# Patient Record
Sex: Male | Born: 1980 | Race: White | Hispanic: No | Marital: Single | Smoking: Light tobacco smoker
Health system: Southern US, Community
[De-identification: ages and names within clinical notes are randomized; demographics above are authoritative.]

## PROBLEM LIST (undated history)

## (undated) DIAGNOSIS — E785 Hyperlipidemia, unspecified: Secondary | ICD-10-CM

## (undated) DIAGNOSIS — L509 Urticaria, unspecified: Secondary | ICD-10-CM

## (undated) HISTORY — DX: Hyperlipidemia, unspecified: E78.5

## (undated) HISTORY — PX: WISDOM TOOTH EXTRACTION: SHX21

## (undated) HISTORY — DX: Urticaria, unspecified: L50.9

---

## 2017-04-15 ENCOUNTER — Encounter: Payer: Self-pay | Admitting: Allergy and Immunology

## 2017-04-15 ENCOUNTER — Ambulatory Visit (INDEPENDENT_AMBULATORY_CARE_PROVIDER_SITE_OTHER): Payer: 59 | Admitting: Allergy and Immunology

## 2017-04-15 VITALS — BP 140/102 | HR 100 | Temp 99.6°F | Resp 18 | Ht 69.17 in | Wt 205.0 lb

## 2017-04-15 DIAGNOSIS — I10 Essential (primary) hypertension: Secondary | ICD-10-CM | POA: Diagnosis not present

## 2017-04-15 DIAGNOSIS — T7840XA Allergy, unspecified, initial encounter: Secondary | ICD-10-CM | POA: Diagnosis not present

## 2017-04-15 DIAGNOSIS — L719 Rosacea, unspecified: Secondary | ICD-10-CM

## 2017-04-15 NOTE — Progress Notes (Signed)
NEW PATIENT NOTE  Referring Provider: No ref. provider found Primary Provider: Patient, No Pcp Per Date of office visit: 04/15/2017    Subjective:   Chief Complaint:  Brian Rangel (DOB: 24-Mar-1981) is a 36 y.o. male who presents to the clinic on 04/15/2017 with a chief complaint of Rash (Began in April.) .  HPI: Brian Rangel presents to this clinic in evaluation of allergic reaction. Apparently in April he developed red raised slightly itchy warm lesions across his face that ended up spreading to other areas of his body without any associated systemic or constitutional symptoms and with response to over-the-counter antihistamines within hours. His lesions never healed with scar or hyperpigmentation. There was no obvious trigger giving rise to this issue. On his first reaction, which was his worst, he went to the urgent care center and received a systemic steroid and then he subsequently visited with [facility] dermatology and was treated with prednisone and Allegra about a month ago. Over the course of the past 2 weeks he's had minimal issue and over the course of the past week he has really had no issue.  As stated above there was no trigger for this reactivity. He does not have any symptoms suggesting an infection, he is not sexually active and has not been for 2018, he takes no over-the-counter medications or health foods or herbs. His environment has not changed to any significant degree.  He does have a history of remaining red in his face for a prolonged period in time. Whenever he gets warm and hot he does flush. This has been a long-standing issue. He also had a history of childhood hives whenever he mowed the grass which has since resolved. Apparently he was skin tested sometime in elementary school and found to be allergic to pollens and dust and cats.  He has also been told that he has high blood pressure. Apparently when he checks his blood pressure at home it is normal.  History  reviewed. No pertinent past medical history.  Past Surgical History:  Procedure Laterality Date  . WISDOM TOOTH EXTRACTION      Allergies as of 04/15/2017   No Known Allergies     Medication List      BENADRYL 25 MG tablet Generic drug:  diphenhydrAMINE Take 25 mg by mouth as needed.   fexofenadine 180 MG tablet Commonly known as:  ALLEGRA Take 180 mg by mouth 2 (two) times daily.       Review of systems negative except as noted in HPI / PMHx or noted below:  Review of Systems  Constitutional: Negative.   HENT: Negative.   Eyes: Negative.   Respiratory: Negative.   Cardiovascular: Negative.   Gastrointestinal: Negative.   Genitourinary: Negative.   Musculoskeletal: Negative.   Skin: Negative.   Neurological: Negative.   Endo/Heme/Allergies: Negative.   Psychiatric/Behavioral: Negative.     History reviewed. No pertinent family history.  Social History   Social History  . Marital status: Single    Spouse name: N/A  . Number of children: N/A  . Years of education: N/A   Occupational History  . Not on file.   Social History Main Topics  . Smoking status: Light Tobacco Smoker  . Smokeless tobacco: Never Used  . Alcohol use Yes  . Drug use: No  . Sexual activity: Not on file   Other Topics Concern  . Not on file   Social History Narrative  . No narrative on file  Environmental and Social history  Lives in a house with a dry environment, a dog located inside the household, carpeting in the bedroom, no plastic on the bed or pillow, no smoking ongoing with inside the household. He works as a Associate Professorpharmacy tech.  Objective:   Vitals:   04/15/17 1347  BP: (!) 140/102  Pulse: 100  Resp: 18  Temp: 99.6 F (37.6 C)   Height: 5' 9.17" (175.7 cm) Weight: 205 lb (93 kg)  Physical Exam  Constitutional: He is well-developed, well-nourished, and in no distress.  HENT:  Head: Normocephalic. Head is without right periorbital erythema and without left  periorbital erythema.  Right Ear: Tympanic membrane, external ear and ear canal normal.  Left Ear: Tympanic membrane, external ear and ear canal normal.  Nose: Nose normal. No mucosal edema or rhinorrhea.  Mouth/Throat: Oropharynx is clear and moist and mucous membranes are normal. No oropharyngeal exudate.  Eyes: Conjunctivae and lids are normal. Pupils are equal, round, and reactive to light.  Neck: Trachea normal. No tracheal deviation present. No thyromegaly present.  Cardiovascular: Normal rate, regular rhythm, S1 normal, S2 normal and normal heart sounds.   No murmur heard. Pulmonary/Chest: Effort normal. No stridor. No tachypnea. No respiratory distress. He has no wheezes. He has no rales. He exhibits no tenderness.  Abdominal: Soft. He exhibits no distension and no mass. There is no hepatosplenomegaly. There is no tenderness. There is no rebound and no guarding.  Musculoskeletal: He exhibits no edema or tenderness.  Lymphadenopathy:       Head (right side): No tonsillar adenopathy present.       Head (left side): No tonsillar adenopathy present.    He has no cervical adenopathy.    He has no axillary adenopathy.  Neurological: He is alert. Gait normal.  Skin: No rash (Facial erythema and telangiectasia.) noted. He is not diaphoretic. No erythema. No pallor. Nails show no clubbing.  Psychiatric: Mood and affect normal.    Diagnostics:  None   Assessment and Plan:    1. Allergic reaction, initial encounter   2. Rosacea   3. Hypertension, unspecified type     1. Contact clinic if reactions reoccur  2. Check blood pressure  3. Consider therapy for rosacea  It does sound as though Brian Rangel has developed some type of immunological overactivity sometime in April but this appears to be burning out and at this point in time because he is so much better and has had no activity over the course of the past week, he is going to hold off on any further evaluation or treatment for this  condition. Certainly if this is recurrent he will require further evaluation and he will contact me should he have any significant problems as he moves forward. He also appears to have rosacea but at this point is not that interested in having this treated. He has systemic arterial hypertension and I've asked him to make sure that this is a isolated reading and does not signify a trend. As well, he does have a low-grade fever of unknown etiology but we are not going to have him evaluated for this issue at this point in time unless it evolves into something else. Should he develop recurrent problems with allergic reactions in the future he will need blood tests including investigation of alpha gal and possible flushing disorder.  Laurette SchimkeEric Evelynn Hench, MD Allergy / Immunology [facility]

## 2017-04-15 NOTE — Patient Instructions (Addendum)
  1. Contact clinic if reactions reoccur  2. Check blood pressure  3. Consider therapy for rosacea

## 2017-06-11 ENCOUNTER — Ambulatory Visit (INDEPENDENT_AMBULATORY_CARE_PROVIDER_SITE_OTHER): Payer: 59 | Admitting: Allergy

## 2017-06-11 ENCOUNTER — Encounter: Payer: Self-pay | Admitting: Allergy

## 2017-06-11 VITALS — BP 142/102 | HR 92 | Resp 18

## 2017-06-11 DIAGNOSIS — R21 Rash and other nonspecific skin eruption: Secondary | ICD-10-CM

## 2017-06-11 DIAGNOSIS — T7840XD Allergy, unspecified, subsequent encounter: Secondary | ICD-10-CM | POA: Diagnosis not present

## 2017-06-11 NOTE — Progress Notes (Signed)
Follow-up Note  RE: Brian Rifflearon Haren MRN: [id number] DOB: 20-Apr-1981 Date of Office Visit: 06/11/2017   History of present illness: Brian Rangel is a 36 y.o. male presenting today for follow-up of rash last allergic reaction. He was last seen in the office on 04/15/2017 by Dr. Lucie LeatherKozlow for an initial visit at which time he did not have any subsequent significant rash other than facial erythema and telangiectasia most consistent with rosacea. He was advised to return to the office if his rash developed. He presents today as he woke up this morning with significant rash.  He states he had had an episode of this same rash earlier in June prior to his first appointment with us. By the time of his appointment the rash had resolved. However the rash is very visible today. The rash appears on his face, neck, chest, arms, legs but is absent from his back. He states the rash on his neck is the only area that the rash felt very raised and palpable. He states the rash is not itchy and he otherwise doesn't notice the rash but he woke up as he felt very hot and then noted that he was covered in this rash. He states when he had the same appearing rash previously he was using antihistamine but was able to stop with resolution of the rash. He was not able to identify any obvious triggers at that time. The rash does not leave any marks or bruising.     today she still does not identify any obvious triggers of this rash. He states for dinner he ate a chef salad with ham, cheese and ranch dressing. He denies any known tick bites in the past. He states he is not a large red meat eater.  He denies any stings, no change in soaps or lotions or detergents, no new medications, no new foods, and no recent illnesses. He denies any respiratory, GI, CV related symptoms with this rash. He also denies any joint aches or pains and no swelling.        Review of systems: ROS  All other systems negative unless noted above in HPI  Past  medical/social/surgical/family history have been reviewed and are unchanged unless specifically indicated below.  No changes  Medication List: Allergies as of 06/11/2017   No Known Allergies     Medication List       Accurate as of 06/11/17  5:06 PM. Always use your most recent med list.          BENADRYL 25 MG tablet Generic drug:  diphenhydrAMINE Take 25 mg by mouth as needed.       Known medication allergies: No Known Allergies   Physical examination: Blood pressure (!) 142/102, pulse 92, resp. rate 18.  General: Alert, interactive, in no acute distress. HEENT: PERRLA, TMs pearly gray, turbinates minimally edematous without discharge, post-pharynx non erythematous. Neck: Supple without lymphadenopathy. Lungs: Clear to auscultation without wheezing, rhonchi or rales. {no increased work of breathing. CV: Normal S1, S2 without murmurs. Abdomen: Nondistended, nontender. No HSM Skin: Serpiginous-like rash with erythematous border and central clearing very consistent with an urticarial-like rash over arms, chest, legs, neck extending to the face. He remains with facial erythema and telangiectasia. Extremities:  No clubbing, cyanosis or edema. Neuro:   Grossly intact.  Diagnositics/Labs:  Allergy testing: Deferred due to ongoing rash  Assessment and plan:   Rash/Allergic reaction     - rash appears urticarial in nature however without pruritus and no systemic  symptoms (GI, respiratory, cardiovascular).   Erythema marginatum is a serpiginous rash with similar appearance to patient's rash and it is usually non-pruritic associated with rheumatic fever/Infections however he has no recent illness and no recent strep infections.   He does not have any blistering, scaling or peeling, pustules or purpura suggestive of other more serious type rashes.   He does not having any systemic symptoms associated with this rash that may be suggestive of infection, rheumatologic or autoimmune  disease. At this time unknown trigger of this rash.  May have an allergic cause.  Will obtain screening labwork at this time: CBC, CMP, hive panel, alpha-gal panel, tryptase, inflammatory markers.        - resume use of allegra 180  2 tabs twice a day and Zantac 150mg  1 tab twice a day.  Reserve benadyrl for breakthrough symptoms.       - let us know if you develop fevers, swelling, joint aches/pains or any other symptoms with the rash.       - If rash persists may warrant additional dermatology evaluation with biopsy  Elevated Blood pressure    - BP today is elevated.  Would discuss this with your PCP as may need antihypertensives for control     - monitor ambulatory BPs  Follow-up 6-8 weeks or sooner if needed  I appreciate the opportunity to take part in Judson's care. Please do not hesitate to contact me with questions.  Sincerely,   Margo Aye, MD Allergy/Immunology [facility]

## 2017-06-11 NOTE — Patient Instructions (Addendum)
Rash     - rash appears urticarial in nature however without pruritus (itch) and no systemic symptoms (GI, respiratory, cardiovascular).  At this time unknown trigger of this rash.  May have an allergic cause.  Will obtain screening labwork at this time: CBC, CMP, hive panel, alpha-gal panel, tryptase, inflammatory markers.  Will call with results     - resume use of allegra 180  2 tabs twice a day and Zantac 150mg  1 tab twice a day.  Reserve benadyrl for breakthrough symptoms.       - let us know if you develop fevers, swelling, joint aches/pains or any other symptoms with the rash.    High Blood pressure    - BP today is elevated.  Would discuss this with your PCP as may need antihypertensives for control     - monitor ambulatory BPs  Follow-up 6-8 weeks or sooner if needed

## 2017-06-18 LAB — C-REACTIVE PROTEIN: CRP: 1.1 mg/L (ref 0.0–4.9)

## 2017-06-18 LAB — COMPREHENSIVE METABOLIC PANEL
A/G RATIO: 1.9 (ref 1.2–2.2)
ALT: 77 IU/L — AB (ref 0–44)
AST: 55 IU/L — AB (ref 0–40)
Albumin: 4.6 g/dL (ref 3.5–5.5)
Alkaline Phosphatase: 71 IU/L (ref 39–117)
BUN/Creatinine Ratio: 12 (ref 9–20)
BUN: 11 mg/dL (ref 6–20)
Bilirubin Total: 0.6 mg/dL (ref 0.0–1.2)
CALCIUM: 9.5 mg/dL (ref 8.7–10.2)
CO2: 22 mmol/L (ref 20–29)
CREATININE: 0.93 mg/dL (ref 0.76–1.27)
Chloride: 102 mmol/L (ref 96–106)
GFR calc Af Amer: 120 mL/min/{1.73_m2} (ref >59)
GFR, EST NON AFRICAN AMERICAN: 104 mL/min/{1.73_m2} (ref >59)
GLOBULIN, TOTAL: 2.4 g/dL (ref 1.5–4.5)
Glucose: 99 mg/dL (ref 65–99)
POTASSIUM: 4.1 mmol/L (ref 3.5–5.2)
SODIUM: 143 mmol/L (ref 134–144)
TOTAL PROTEIN: 7 g/dL (ref 6.0–8.5)

## 2017-06-18 LAB — ALPHA-GAL PANEL
Alpha Gal IgE*: <0.1 kU/L (ref <0.35)
BEEF CLASS INTERPRETATION: 0
Beef (Bos spp) IgE: <0.1 kU/L (ref <0.35)
Class Interpretation: 0
Class Interpretation: 0
Pork (Sus spp) IgE: <0.1 kU/L (ref <0.35)

## 2017-06-18 LAB — CBC WITH DIFFERENTIAL/PLATELET
BASOS: 0 %
Basophils Absolute: 0 10*3/uL (ref 0.0–0.2)
EOS (ABSOLUTE): 0.1 10*3/uL (ref 0.0–0.4)
EOS: 1 %
HEMATOCRIT: 48.6 % (ref 37.5–51.0)
Hemoglobin: 16.7 g/dL (ref 13.0–17.7)
IMMATURE GRANULOCYTES: 0 %
Immature Grans (Abs): 0 10*3/uL (ref 0.0–0.1)
Lymphocytes Absolute: 1.8 10*3/uL (ref 0.7–3.1)
Lymphs: 23 %
MCH: 33.1 pg — ABNORMAL HIGH (ref 26.6–33.0)
MCHC: 34.4 g/dL (ref 31.5–35.7)
MCV: 96 fL (ref 79–97)
MONOCYTES: 6 %
Monocytes Absolute: 0.4 10*3/uL (ref 0.1–0.9)
NEUTROS PCT: 70 %
Neutrophils Absolute: 5.2 10*3/uL (ref 1.4–7.0)
Platelets: 175 10*3/uL (ref 150–379)
RBC: 5.04 x10E6/uL (ref 4.14–5.80)
RDW: 14.6 % (ref 12.3–15.4)
WBC: 7.5 10*3/uL (ref 3.4–10.8)

## 2017-06-18 LAB — SEDIMENTATION RATE: Sed Rate: 2 mm/hr (ref 0–15)

## 2017-06-18 LAB — CHRONIC URTICARIA: cu index: 3.2 (ref <10)

## 2017-06-18 LAB — TRYPTASE: TRYPTASE: 4.1 ug/L (ref 2.2–13.2)

## 2017-06-19 ENCOUNTER — Telehealth: Payer: Self-pay | Admitting: Allergy

## 2017-06-19 NOTE — Telephone Encounter (Signed)
Resulted out labs.  Someone can call him tomorrow.

## 2017-06-19 NOTE — Telephone Encounter (Signed)
Brian Rangel has called twice to get his lab results.  Please advise.

## 2017-06-20 ENCOUNTER — Other Ambulatory Visit: Payer: Self-pay | Admitting: *Deleted

## 2017-06-20 MED ORDER — MONTELUKAST SODIUM 10 MG PO TABS: ORAL_TABLET | ORAL | 5 refills | Status: DC

## 2017-07-05 ENCOUNTER — Other Ambulatory Visit: Payer: Self-pay | Admitting: *Deleted

## 2017-07-05 ENCOUNTER — Telehealth: Payer: Self-pay | Admitting: Allergy & Immunology

## 2017-07-05 MED ORDER — PREDNISONE 10 MG PO TABS: ORAL_TABLET | ORAL | 0 refills | Status: DC

## 2017-07-05 NOTE — Telephone Encounter (Signed)
Patient informed. 

## 2017-07-05 NOTE — Telephone Encounter (Signed)
He can take Allegra 2 tabs twice a day and the other medication remains how he is currently taking.  To help decrease symptoms please have him take prednisone 20mg  twice a day x 5 days.    He may be a candidate for Xolair.

## 2017-07-05 NOTE — Telephone Encounter (Signed)
Prednisone sent, left message for patient to call.

## 2017-07-05 NOTE — Telephone Encounter (Signed)
Brian Rangel has seen both Dr. Lucie LeatherKozlow and Dr. Delorse LekPadgett.  Recently Brian CustardAaron came in to see Dr. Delorse LekPadgett for an all over body rash.  Dr. Delorse LekPadgett sent him for blood work which came back negative.  Brian Rangel has been taking SINGULAIR once a day and ALLEGRA and ZANTAC twice a day.  Brian Rangel also has taken BENADRYL and nothing is helping the rash.  He stated the rash is on his arms,chest,neck, and face.  He also stated the itching is severe and would like something to help.  Please advise.

## 2017-07-26 ENCOUNTER — Telehealth: Payer: Self-pay | Admitting: *Deleted

## 2017-07-26 NOTE — Telephone Encounter (Signed)
Patient called and advised that he is still breaking out and Dr Delorse Lek had mentioned that he might be candidate for Xolair.  He advised still using all meds and still symptomatic.  I called Dr Delorse Lek and she advised that ok to submit for Xolair.  Reviewed with patient process and he came by office and signed auth to submit and was given copay and submit info.  Will be in contact once Rx ready and shipped to get him started.

## 2017-08-06 ENCOUNTER — Other Ambulatory Visit: Payer: Self-pay | Admitting: *Deleted

## 2017-08-06 ENCOUNTER — Telehealth: Payer: Self-pay | Admitting: *Deleted

## 2017-08-06 MED ORDER — PREDNISONE 10 MG PO TABS: ORAL_TABLET | ORAL | 0 refills | Status: DC

## 2017-08-06 NOTE — Telephone Encounter (Signed)
Please have patient use prednisone 10 mg once a day for 7 days only

## 2017-08-06 NOTE — Telephone Encounter (Signed)
Patient informed, rx sent.  

## 2017-08-06 NOTE — Telephone Encounter (Signed)
Patient called advised having hives really bad.  Due to start Xolair possibly on Monday cannot any sooner due to working 10 hour days.  He advised is taking antihistamine and benadryl with no relief.  He is requesting Prednisone to help flareup until he can start his Xolair.FYI this Brian Rangel patient but she is currently out of office advised him will send to another provider

## 2017-08-15 ENCOUNTER — Ambulatory Visit (INDEPENDENT_AMBULATORY_CARE_PROVIDER_SITE_OTHER): Payer: 59 | Admitting: *Deleted

## 2017-08-15 DIAGNOSIS — L501 Idiopathic urticaria: Secondary | ICD-10-CM | POA: Diagnosis not present

## 2017-08-15 MED ORDER — AUVI-Q 0.3 MG/0.3ML IJ SOAJ: INTRAMUSCULAR | 3 refills | Status: DC

## 2017-08-15 MED ORDER — OMALIZUMAB 150 MG ~~LOC~~ SOLR
300.0000 mg | SUBCUTANEOUS | Status: DC
  Administered 2017-08-15 – 2018-07-16 (×12): 300 mg via SUBCUTANEOUS

## 2017-08-15 NOTE — Progress Notes (Signed)
Patient started Xolair injections today for his hives - he will receive 300 mg every 4 weeks.  All consent and instructions have been reviewed.  Auvi-q will be sent to [facility] - he was shown how to use and when to use.

## 2017-09-12 ENCOUNTER — Ambulatory Visit (INDEPENDENT_AMBULATORY_CARE_PROVIDER_SITE_OTHER): Payer: 59 | Admitting: *Deleted

## 2017-09-12 DIAGNOSIS — L501 Idiopathic urticaria: Secondary | ICD-10-CM

## 2017-10-10 ENCOUNTER — Ambulatory Visit (INDEPENDENT_AMBULATORY_CARE_PROVIDER_SITE_OTHER): Payer: 59 | Admitting: *Deleted

## 2017-10-10 DIAGNOSIS — L501 Idiopathic urticaria: Secondary | ICD-10-CM | POA: Diagnosis not present

## 2017-11-07 ENCOUNTER — Ambulatory Visit (INDEPENDENT_AMBULATORY_CARE_PROVIDER_SITE_OTHER): Payer: 59 | Admitting: *Deleted

## 2017-11-07 DIAGNOSIS — L501 Idiopathic urticaria: Secondary | ICD-10-CM | POA: Diagnosis not present

## 2017-11-27 ENCOUNTER — Other Ambulatory Visit: Payer: Self-pay | Admitting: Allergy

## 2017-11-27 NOTE — Telephone Encounter (Signed)
Courtesy refill  

## 2017-12-05 ENCOUNTER — Ambulatory Visit (INDEPENDENT_AMBULATORY_CARE_PROVIDER_SITE_OTHER): Payer: 59 | Admitting: *Deleted

## 2017-12-05 DIAGNOSIS — L501 Idiopathic urticaria: Secondary | ICD-10-CM | POA: Diagnosis not present

## 2018-01-02 ENCOUNTER — Ambulatory Visit (INDEPENDENT_AMBULATORY_CARE_PROVIDER_SITE_OTHER): Payer: 59 | Admitting: *Deleted

## 2018-01-02 DIAGNOSIS — L501 Idiopathic urticaria: Secondary | ICD-10-CM | POA: Diagnosis not present

## 2018-01-30 ENCOUNTER — Ambulatory Visit: Payer: Self-pay

## 2018-02-03 ENCOUNTER — Ambulatory Visit (INDEPENDENT_AMBULATORY_CARE_PROVIDER_SITE_OTHER): Payer: 59 | Admitting: *Deleted

## 2018-02-03 DIAGNOSIS — L501 Idiopathic urticaria: Secondary | ICD-10-CM | POA: Diagnosis not present

## 2018-03-03 ENCOUNTER — Ambulatory Visit: Payer: Self-pay

## 2018-03-10 ENCOUNTER — Ambulatory Visit (INDEPENDENT_AMBULATORY_CARE_PROVIDER_SITE_OTHER): Payer: 59 | Admitting: *Deleted

## 2018-03-10 DIAGNOSIS — L501 Idiopathic urticaria: Secondary | ICD-10-CM | POA: Diagnosis not present

## 2018-04-07 ENCOUNTER — Ambulatory Visit: Payer: 59

## 2018-04-14 ENCOUNTER — Ambulatory Visit (INDEPENDENT_AMBULATORY_CARE_PROVIDER_SITE_OTHER): Payer: 59 | Admitting: *Deleted

## 2018-04-14 DIAGNOSIS — L501 Idiopathic urticaria: Secondary | ICD-10-CM

## 2018-05-12 ENCOUNTER — Ambulatory Visit: Payer: Self-pay

## 2018-05-20 ENCOUNTER — Ambulatory Visit (INDEPENDENT_AMBULATORY_CARE_PROVIDER_SITE_OTHER): Payer: 59 | Admitting: *Deleted

## 2018-05-20 DIAGNOSIS — L501 Idiopathic urticaria: Secondary | ICD-10-CM

## 2018-06-17 ENCOUNTER — Ambulatory Visit: Payer: 59

## 2018-06-18 ENCOUNTER — Ambulatory Visit (INDEPENDENT_AMBULATORY_CARE_PROVIDER_SITE_OTHER): Payer: 59

## 2018-06-18 DIAGNOSIS — L501 Idiopathic urticaria: Secondary | ICD-10-CM | POA: Diagnosis not present

## 2018-07-16 ENCOUNTER — Encounter: Payer: Self-pay | Admitting: Allergy and Immunology

## 2018-07-16 ENCOUNTER — Ambulatory Visit (INDEPENDENT_AMBULATORY_CARE_PROVIDER_SITE_OTHER): Payer: 59 | Admitting: Allergy and Immunology

## 2018-07-16 ENCOUNTER — Ambulatory Visit: Payer: Self-pay

## 2018-07-16 DIAGNOSIS — L501 Idiopathic urticaria: Secondary | ICD-10-CM

## 2018-07-16 MED ORDER — OMALIZUMAB 150 MG ~~LOC~~ SOLR
150.0000 mg | SUBCUTANEOUS | Status: DC
  Administered 2018-08-14 – 2020-07-21 (×20): 150 mg via SUBCUTANEOUS

## 2018-07-16 NOTE — Patient Instructions (Signed)
  1.  Decrease omalizumab to 150 mg every 4 weeks  2.  Continue Auvi-Q if needed  3.  Obtain full flu vaccine  4.  Return to clinic in 1 year or earlier if problem

## 2018-07-16 NOTE — Progress Notes (Signed)
Follow-up Note  Referring Provider: No ref. provider found Primary Provider: Patient, No Pcp Per Date of Office Visit: 07/16/2018  Subjective:   Brian Rangel (DOB: 11-23-1980) is a 37 y.o. male who [facility] in re-evaluation of the following:  HPI: Brian Rangel returns to this clinic in reevaluation of his recurrent urticarial reactions.  He was last seen in this clinic by Dr. Delorse Lek on 11 June 2017 at which point in time he started omalizumab.  He has not had any urticaria or reactions since starting omalizumab.  He does not use any other medications at this point in time.  He is currently using 300 mg of omalizumab every 4 weeks.  Allergies as of 07/16/2018   No Known Allergies     Medication List      XOLAIR 150 MG injection Generic drug:  omalizumab       Past Medical History:  Diagnosis Date  . Urticaria     Past Surgical History:  Procedure Laterality Date  . WISDOM TOOTH EXTRACTION      Review of systems negative except as noted in HPI / PMHx or noted below:  Review of Systems  Constitutional: Negative.   HENT: Negative.   Eyes: Negative.   Respiratory: Negative.   Cardiovascular: Negative.   Gastrointestinal: Negative.   Genitourinary: Negative.   Musculoskeletal: Negative.   Skin: Negative.   Neurological: Negative.   Endo/Heme/Allergies: Negative.   Psychiatric/Behavioral: Negative.      Objective:   Vitals:   07/16/18 1549  BP: (!) 140/100  Pulse: 80  Resp: 20          Physical Exam  HENT:  Head: Normocephalic. Head is without right periorbital erythema and without left periorbital erythema.  Right Ear: Tympanic membrane, external ear and ear canal normal.  Left Ear: Tympanic membrane, external ear and ear canal normal.  Nose: Nose normal. No mucosal edema or rhinorrhea.  Mouth/Throat: Oropharynx is clear and moist and mucous membranes are normal. No oropharyngeal exudate.  Eyes: Pupils  are equal, round, and reactive to light. Conjunctivae and lids are normal.  Neck: Trachea normal. No tracheal deviation present. No thyromegaly present.  Cardiovascular: Normal rate, regular rhythm, S1 normal, S2 normal and normal heart sounds.  No murmur heard. Pulmonary/Chest: Effort normal. No stridor. No respiratory distress. He has no wheezes. He has no rales. He exhibits no tenderness.  Abdominal: Soft. He exhibits no distension and no mass. There is no hepatosplenomegaly. There is no tenderness. There is no rebound and no guarding.  Musculoskeletal: He exhibits no edema or tenderness.  Lymphadenopathy:       Head (right side): No tonsillar adenopathy present.       Head (left side): No tonsillar adenopathy present.    He has no cervical adenopathy.    He has no axillary adenopathy.  Neurological: He is alert.  Skin: No rash noted. He is not diaphoretic. No erythema. No pallor. Nails show no clubbing.    Diagnostics: none  Assessment and Plan:   1. Idiopathic urticaria     1.  Decrease omalizumab to 150 mg every 4 weeks  2.  Continue Auvi-Q if needed  3.  Obtain full flu vaccine  4.  Return to clinic in 1 year or earlier if problem  Brian Rangel appears to have done very well on omalizumab and we will now see if we can decrease his dose of this medication as noted above.  If he  continues to do well I will see him back in his clinic in 1 year or earlier if there is a problem.  Laurette Schimke, MD Allergy / Immunology [facility]

## 2018-07-17 ENCOUNTER — Encounter: Payer: Self-pay | Admitting: Allergy and Immunology

## 2018-07-25 ENCOUNTER — Other Ambulatory Visit: Payer: Self-pay | Admitting: Allergy

## 2018-08-13 ENCOUNTER — Ambulatory Visit: Payer: 59

## 2018-08-14 ENCOUNTER — Ambulatory Visit (INDEPENDENT_AMBULATORY_CARE_PROVIDER_SITE_OTHER): Payer: 59 | Admitting: *Deleted

## 2018-08-14 DIAGNOSIS — L501 Idiopathic urticaria: Secondary | ICD-10-CM

## 2018-09-11 ENCOUNTER — Ambulatory Visit (INDEPENDENT_AMBULATORY_CARE_PROVIDER_SITE_OTHER): Payer: 59 | Admitting: *Deleted

## 2018-09-11 DIAGNOSIS — L501 Idiopathic urticaria: Secondary | ICD-10-CM | POA: Diagnosis not present

## 2018-10-09 ENCOUNTER — Ambulatory Visit (INDEPENDENT_AMBULATORY_CARE_PROVIDER_SITE_OTHER): Payer: 59 | Admitting: *Deleted

## 2018-10-09 DIAGNOSIS — L501 Idiopathic urticaria: Secondary | ICD-10-CM | POA: Diagnosis not present

## 2018-11-06 ENCOUNTER — Ambulatory Visit: Payer: Self-pay

## 2018-11-11 ENCOUNTER — Ambulatory Visit (INDEPENDENT_AMBULATORY_CARE_PROVIDER_SITE_OTHER): Payer: 59 | Admitting: *Deleted

## 2018-11-11 DIAGNOSIS — L501 Idiopathic urticaria: Secondary | ICD-10-CM

## 2018-12-09 ENCOUNTER — Ambulatory Visit: Payer: Self-pay

## 2018-12-15 ENCOUNTER — Ambulatory Visit (INDEPENDENT_AMBULATORY_CARE_PROVIDER_SITE_OTHER): Payer: 59 | Admitting: *Deleted

## 2018-12-15 DIAGNOSIS — L501 Idiopathic urticaria: Secondary | ICD-10-CM

## 2019-01-12 ENCOUNTER — Ambulatory Visit (INDEPENDENT_AMBULATORY_CARE_PROVIDER_SITE_OTHER): Payer: 59 | Admitting: *Deleted

## 2019-01-12 DIAGNOSIS — L501 Idiopathic urticaria: Secondary | ICD-10-CM

## 2019-02-09 ENCOUNTER — Ambulatory Visit (INDEPENDENT_AMBULATORY_CARE_PROVIDER_SITE_OTHER): Payer: 59 | Admitting: *Deleted

## 2019-02-09 ENCOUNTER — Other Ambulatory Visit: Payer: Self-pay

## 2019-02-09 DIAGNOSIS — L501 Idiopathic urticaria: Secondary | ICD-10-CM

## 2019-03-09 ENCOUNTER — Ambulatory Visit (INDEPENDENT_AMBULATORY_CARE_PROVIDER_SITE_OTHER): Payer: 59 | Admitting: *Deleted

## 2019-03-09 ENCOUNTER — Other Ambulatory Visit: Payer: Self-pay

## 2019-03-09 DIAGNOSIS — L501 Idiopathic urticaria: Secondary | ICD-10-CM | POA: Diagnosis not present

## 2019-04-07 ENCOUNTER — Ambulatory Visit: Payer: Self-pay

## 2019-04-08 ENCOUNTER — Ambulatory Visit (INDEPENDENT_AMBULATORY_CARE_PROVIDER_SITE_OTHER): Payer: 59 | Admitting: *Deleted

## 2019-04-08 DIAGNOSIS — L501 Idiopathic urticaria: Secondary | ICD-10-CM

## 2019-05-06 ENCOUNTER — Ambulatory Visit: Payer: 59

## 2019-05-18 ENCOUNTER — Ambulatory Visit (INDEPENDENT_AMBULATORY_CARE_PROVIDER_SITE_OTHER): Payer: 59 | Admitting: *Deleted

## 2019-05-18 ENCOUNTER — Other Ambulatory Visit: Payer: Self-pay

## 2019-05-18 DIAGNOSIS — L501 Idiopathic urticaria: Secondary | ICD-10-CM | POA: Diagnosis not present

## 2019-06-15 ENCOUNTER — Ambulatory Visit: Payer: Self-pay

## 2019-06-17 ENCOUNTER — Ambulatory Visit (INDEPENDENT_AMBULATORY_CARE_PROVIDER_SITE_OTHER): Payer: 59 | Admitting: *Deleted

## 2019-06-17 ENCOUNTER — Other Ambulatory Visit: Payer: Self-pay

## 2019-06-17 DIAGNOSIS — L501 Idiopathic urticaria: Secondary | ICD-10-CM

## 2019-07-15 ENCOUNTER — Ambulatory Visit (INDEPENDENT_AMBULATORY_CARE_PROVIDER_SITE_OTHER): Payer: 59 | Admitting: *Deleted

## 2019-07-15 ENCOUNTER — Other Ambulatory Visit: Payer: Self-pay

## 2019-07-15 DIAGNOSIS — L501 Idiopathic urticaria: Secondary | ICD-10-CM

## 2019-08-12 ENCOUNTER — Ambulatory Visit: Payer: Self-pay

## 2019-08-17 ENCOUNTER — Other Ambulatory Visit: Payer: Self-pay

## 2019-08-17 ENCOUNTER — Ambulatory Visit (INDEPENDENT_AMBULATORY_CARE_PROVIDER_SITE_OTHER): Payer: 59 | Admitting: *Deleted

## 2019-08-17 DIAGNOSIS — L501 Idiopathic urticaria: Secondary | ICD-10-CM | POA: Diagnosis not present

## 2019-09-07 ENCOUNTER — Other Ambulatory Visit: Payer: Self-pay | Admitting: Allergy

## 2019-09-14 ENCOUNTER — Other Ambulatory Visit: Payer: Self-pay

## 2019-09-14 ENCOUNTER — Ambulatory Visit (INDEPENDENT_AMBULATORY_CARE_PROVIDER_SITE_OTHER): Payer: 59 | Admitting: *Deleted

## 2019-09-14 DIAGNOSIS — L501 Idiopathic urticaria: Secondary | ICD-10-CM | POA: Diagnosis not present

## 2019-10-12 ENCOUNTER — Ambulatory Visit (INDEPENDENT_AMBULATORY_CARE_PROVIDER_SITE_OTHER): Payer: 59 | Admitting: *Deleted

## 2019-10-12 DIAGNOSIS — L501 Idiopathic urticaria: Secondary | ICD-10-CM

## 2019-11-09 ENCOUNTER — Ambulatory Visit (INDEPENDENT_AMBULATORY_CARE_PROVIDER_SITE_OTHER): Payer: 59 | Admitting: *Deleted

## 2019-11-09 ENCOUNTER — Other Ambulatory Visit: Payer: Self-pay

## 2019-11-09 DIAGNOSIS — L501 Idiopathic urticaria: Secondary | ICD-10-CM

## 2019-12-07 ENCOUNTER — Ambulatory Visit (INDEPENDENT_AMBULATORY_CARE_PROVIDER_SITE_OTHER): Payer: 59 | Admitting: *Deleted

## 2019-12-07 ENCOUNTER — Other Ambulatory Visit: Payer: Self-pay

## 2019-12-07 DIAGNOSIS — L501 Idiopathic urticaria: Secondary | ICD-10-CM

## 2020-01-04 ENCOUNTER — Ambulatory Visit: Payer: 59

## 2020-01-06 ENCOUNTER — Ambulatory Visit (INDEPENDENT_AMBULATORY_CARE_PROVIDER_SITE_OTHER): Payer: 59 | Admitting: *Deleted

## 2020-01-06 DIAGNOSIS — L501 Idiopathic urticaria: Secondary | ICD-10-CM | POA: Diagnosis not present

## 2020-01-11 ENCOUNTER — Encounter: Payer: Self-pay | Admitting: Allergy and Immunology

## 2020-01-11 ENCOUNTER — Other Ambulatory Visit: Payer: Self-pay

## 2020-01-11 ENCOUNTER — Ambulatory Visit (INDEPENDENT_AMBULATORY_CARE_PROVIDER_SITE_OTHER): Payer: 59 | Admitting: Allergy and Immunology

## 2020-01-11 VITALS — BP 138/102 | HR 84 | Temp 97.9°F | Resp 16 | Ht 70.3 in | Wt 215.2 lb

## 2020-01-11 DIAGNOSIS — L501 Idiopathic urticaria: Secondary | ICD-10-CM | POA: Diagnosis not present

## 2020-01-11 NOTE — Patient Instructions (Addendum)
  1.  Continue omalizumab 150 mg: attempt to increase interval to every 8 weeks  2.  Continue Epi-Pen if needed  3.  Return to clinic in 1 year or earlier if problem  4. Obtain Covid vaccine

## 2020-01-11 NOTE — Progress Notes (Signed)
[facility] [place] [facility]   Follow-up Note  Referring Provider: No ref. provider found Primary Provider: Patient, No Pcp Per Date of Office Visit: 01/11/2020  Subjective:   Brian Rangel (DOB: Nov 25, 1980) is a 39 y.o. male who returns to [facility] [place] on 01/11/2020 in re-evaluation of the following:  HPI: Brian Rangel returns to this clinic in reevaluation of his chronic urticaria treated with omalizumab.  His last visit to this clinic was 16 July 2018.   He continues to do well without any urticaria while using 150 mg of omalizumab every 4 weeks.  Allergies as of 01/11/2020   No Known Allergies     Medication List        sterile water (preservative free) injection USE AS DIRECTED   Xolair 150 MG injection Generic drug: omalizumab INJECT 150 MG SUBCUTANEOUSLY EVERY 4 WEEKS.       Past Medical History:  Diagnosis Date  . Urticaria     Past Surgical History:  Procedure Laterality Date  . WISDOM TOOTH EXTRACTION      Review of systems negative except as noted in HPI / PMHx or noted below:  Review of Systems  Constitutional: Negative.   HENT: Negative.   Eyes: Negative.   Respiratory: Negative.   Cardiovascular: Negative.   Gastrointestinal: Negative.   Genitourinary: Negative.   Musculoskeletal: Negative.   Skin: Negative.   Neurological: Negative.   Endo/Heme/Allergies: Negative.   Psychiatric/Behavioral: Negative.      Objective:   Vitals:   01/11/20 1013  BP: (!) 138/102  Pulse: 84  Resp: 16  Temp: 97.9 F (36.6 C)   Height: 5' 10.3" (178.6 cm)  Weight: 215 lb 3.2 oz (97.6 kg)   Physical Exam Constitutional:      Appearance: He is not diaphoretic.  HENT:     Head: Normocephalic.     Right Ear: Tympanic membrane, ear canal and external ear normal.     Left Ear: Tympanic membrane, ear canal and external ear normal.     Nose: Nose normal. No mucosal edema or rhinorrhea.     Mouth/Throat:     Pharynx: Uvula midline. No oropharyngeal exudate.  Eyes:     Conjunctiva/sclera: Conjunctivae normal.  Neck:     Thyroid: No thyromegaly.     Trachea: Trachea normal. No tracheal tenderness or tracheal deviation.  Cardiovascular:     Rate and Rhythm: Normal rate and regular rhythm.     Heart sounds: Normal heart sounds, S1 normal and S2 normal. No murmur.  Pulmonary:     Effort: No respiratory distress.     Breath sounds: Normal breath sounds. No stridor. No wheezing or rales.  Lymphadenopathy:     Head:     Right side of head: No tonsillar adenopathy.     Left side of head: No tonsillar adenopathy.     Cervical: No cervical adenopathy.  Skin:    Findings: No erythema or rash.     Nails: There is no clubbing.  Neurological:     Mental Status: He is alert.     Diagnostics: none  Assessment and Plan:   1. Idiopathic urticaria     1.  Continue omalizumab 150 mg: attempt to increase interval to every 8 weeks  2.  Continue Epi-Pen if needed  3.  Return to clinic in 1 year or earlier if problem  4. Obtain Covid vaccine  Brian Rangel appears to be doing very well since we lowered his  dose of omalizumab to 150 mg every 4 weeks during his last visit.  We will now attempt to increase the interval of administration to every 8 weeks.  I will see him back in this clinic in 1 year or earlier if problem.  Laurette Schimke, MD Allergy / Immunology [facility]

## 2020-01-12 ENCOUNTER — Encounter: Payer: Self-pay | Admitting: Allergy and Immunology

## 2020-02-03 ENCOUNTER — Other Ambulatory Visit: Payer: Self-pay

## 2020-02-03 ENCOUNTER — Ambulatory Visit (INDEPENDENT_AMBULATORY_CARE_PROVIDER_SITE_OTHER): Payer: 59 | Admitting: *Deleted

## 2020-02-03 DIAGNOSIS — L501 Idiopathic urticaria: Secondary | ICD-10-CM

## 2020-03-30 ENCOUNTER — Ambulatory Visit: Payer: 59

## 2020-04-07 ENCOUNTER — Ambulatory Visit (INDEPENDENT_AMBULATORY_CARE_PROVIDER_SITE_OTHER): Payer: No Typology Code available for payment source | Admitting: *Deleted

## 2020-04-07 ENCOUNTER — Other Ambulatory Visit: Payer: Self-pay

## 2020-04-07 DIAGNOSIS — L501 Idiopathic urticaria: Secondary | ICD-10-CM

## 2020-04-07 MED ORDER — OMALIZUMAB 150 MG ~~LOC~~ SOLR
150.0000 mg | SUBCUTANEOUS | Status: DC
  Administered 2020-04-07 – 2021-07-19 (×8): 150 mg via SUBCUTANEOUS

## 2020-04-28 NOTE — Progress Notes (Signed)
Cardiology Office Note:    Date:  04/29/2020   ID:  Brian Rangel, DOB 03-Apr-1981, MRN [id number]  PCP:  Nonnie Done., MD  Cardiologist:  Norman Herrlich, MD   Referring MD: Nonnie Done., MD  ASSESSMENT:    1. Chest pain of uncertain etiology   2. Statin intolerance   3. Pure hypercholesterolemia    PLAN:    In order of problems listed above:  1. Chest pain was quite atypical in the context of statin adverse reaction but is unusual.  We discussed further evaluation is reclassified his cardiovascular risk will undergo a cardiac calcium score of 0 I would not do an ischemia evaluation if elevated will need to consider the merits of lipid-lowering treatment none statin and further ischemia evaluation with cardiac CTA he has a high degree healthcare literacy and agrees with the evaluation. 2. Clearly statin intolerant I would not rechallenge 3. Also check LP(a) with family history  Next appointment will see me in 6 weeks   Medication Adjustments/Labs and Tests Ordered: Current medicines are reviewed at length with the patient today.  Concerns regarding medicines are outlined above.  Orders Placed This Encounter  Procedures   CT CARDIAC SCORING   Lipoprotein A (LPA)   No orders of the defined types were placed in this encounter.    Chief Complaint  Patient presents with   Chest Pain    Follow-up [facility] health ED visit   Hyperlipidemia    History of Present Illness:    Brian Rangel is a 39 y.o. male who is being seen today for the evaluation of chest pain after ED recent [facility] health ED visit at the request of [place], Excell Seltzer., MD. Chart review in epic shows he has a history of chronic idiopathic urticaria. He was seen in the emergency room at [facility] 04/01/2020 with complaint of chest pain CBC was normal hemoglobin of 15.0 BMP was normal except for past potassium 3.0 his EKG showed sinus rhythm and was normal.  His discharge diagnosis was an adverse  reaction to statin medication. Records primary care note that he has significant dyslipidemia LDL of 167 and have been initiated on a atorvastatin as an outpatient that he had palpitation and was diagnosed with paroxysmal atrial tachycardia.  His family history is noteworthy for CAD father Other labs performed 03/11/2020 shows a cholesterol of 236 triglycerides 134 HDL 45 LDL 167 with non-HDL cholesterol of greater than 190.  His 10-year risk for heart disease is low risk factors hyperlipidemia he also has a family history of CAD with father MI in his early 42s.  He has no history of murmur congenital rheumatic heart disease.  His story begins the Friday before the ED visit when he was having neck pain.  Is felt to be musculoskeletal he was treated with prednisone nonsteroidal anti-inflammatory drug amoxicillin with concerns of sinus infection and was initiated on statin.  Within a week he developed diffuse muscle pain and weakness including discomfort in his chest it was nonanginal it lasted for days unrelated to activity on relieved with rest.  He was seen in the ED discharged and within a few days the symptoms resolved and have not recurred.  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  He has no family history of hyper lipidemia.  He has no exercise intolerance shortness of breath exertional chest pain  or palpitation.  There is no fever chills associated with the incident it was not pleuritic in nature and no associated GI symptoms.  Past Medical History:  Diagnosis Date   Hyperlipidemia    Urticaria     Past Surgical History:  Procedure Laterality Date   WISDOM TOOTH EXTRACTION      Current Medications: Current Meds  Medication Sig   EPINEPHrine (EPIPEN 2-PAK) 0.3 mg/0.3 mL IJ SOAJ injection Use as directed for life-threatening allergic reaction.   omalizumab (XOLAIR) 150 MG injection  Inject into the skin every 28 (twenty-eight) days. Every other month   Water For Injection Sterile (STERILE WATER, PRESERVATIVE FREE,) injection USE AS DIRECTED   Current Facility-Administered Medications for the 04/29/20 encounter (Office Visit) with Baldo Daub, MD  Medication   omalizumab [name]) injection 150 mg   omalizumab [name]) injection 150 mg     Allergies:   Patient has no known allergies.   Social History   Socioeconomic History   Marital status: Single    Spouse name: Not on file   Number of children: Not on file   Years of education: Not on file   Highest education level: Not on file  Occupational History   Not on file  Tobacco Use   Smoking status: Light Tobacco Smoker   Smokeless tobacco: Never Used  Substance and Sexual Activity   Alcohol use: Yes   Drug use: No   Sexual activity: Not on file  Other Topics Concern   Not on file  Social History Narrative   Not on file   Social Determinants of Health   Financial Resource Strain:    Difficulty of Paying Living Expenses:   Food Insecurity:    Worried About Running Out of Food in the Last Year:    Barista in the Last Year:   Transportation Needs:    Freight forwarder (Medical):    Lack of Transportation (Non-Medical):   Physical Activity:    Days of Exercise per Week:    Minutes of Exercise per Session:   Stress:    Feeling of Stress :   Social Connections:    Frequency of Communication with Friends and Family:    Frequency of Social Gatherings with Friends and Family:    Attends Religious Services:    Active Member of Clubs or Organizations:    Attends Banker Meetings:    Marital Status:      Family History: The patient's family history includes CAD in his father.  ROS:   Review of Systems  Constitutional: Negative.  HENT: Negative.   Eyes: Negative.   Cardiovascular: Positive for chest pain.  Respiratory: Negative.     Endocrine: Negative.   Hematologic/Lymphatic: Negative.   Skin: Negative.   Musculoskeletal: Positive for muscle weakness, myalgias and neck pain.  Gastrointestinal: Negative.   Genitourinary: Negative.   Neurological: Negative.   Psychiatric/Behavioral: Negative.    Please see the history of present illness.     All other systems reviewed and are negative.  EKGs/Labs/Other Studies Reviewed:    The following studies were reviewed today:   EKG:  EKG is  ordered today.  The ekg ordered today is personally reviewed and demonstrates sinus rhythm and normal  Recent Labs: Although not noted in the ED record the patient showed me a printout of his labs from [facility] including a normal CPK and undetectable troponin  Physical Exam:    VS:  BP 138/90    Pulse Marland Kitchen)  107    Ht 5' 10.3" (1.786 m)    Wt 200 lb (90.7 kg)    SpO2 100%    BMI 28.45 kg/m     Wt Readings from Last 3 Encounters:  04/29/20 200 lb (90.7 kg)  01/11/20 215 lb 3.2 oz (97.6 kg)  04/15/17 205 lb (93 kg)     GEN:  Well nourished, well developed in no acute distress HEENT: Normal NECK: No JVD; No carotid bruits LYMPHATICS: No lymphadenopathy CARDIAC: RRR, no murmurs, rubs, gallops RESPIRATORY:  Clear to auscultation without rales, wheezing or rhonchi  ABDOMEN: Soft, non-tender, non-distended MUSCULOSKELETAL:  No edema; No deformity  SKIN: Warm and dry NEUROLOGIC:  Alert and oriented x 3 PSYCHIATRIC:  Normal affect     Signed, Shirlee More, MD  04/29/2020 9:31 AM    [place]

## 2020-04-29 ENCOUNTER — Other Ambulatory Visit: Payer: Self-pay

## 2020-04-29 ENCOUNTER — Ambulatory Visit (INDEPENDENT_AMBULATORY_CARE_PROVIDER_SITE_OTHER): Payer: No Typology Code available for payment source | Admitting: Cardiology

## 2020-04-29 ENCOUNTER — Encounter: Payer: Self-pay | Admitting: Cardiology

## 2020-04-29 VITALS — BP 138/90 | HR 107 | Ht 70.3 in | Wt 200.0 lb

## 2020-04-29 DIAGNOSIS — R079 Chest pain, unspecified: Secondary | ICD-10-CM

## 2020-04-29 DIAGNOSIS — E7800 Pure hypercholesterolemia, unspecified: Secondary | ICD-10-CM | POA: Diagnosis not present

## 2020-04-29 DIAGNOSIS — Z789 Other specified health status: Secondary | ICD-10-CM

## 2020-04-29 NOTE — Patient Instructions (Addendum)
Medication Instructions:  Your physician recommends that you continue on your current medications as directed. Please refer to the Current Medication list given to you today.  *If you need a refill on your cardiac medications before your next appointment, please call your pharmacy*   Lab Work: Your physician recommends that you return for lab work in: TODAY Lpa If you have labs (blood work) drawn today and your tests are completely normal, you will receive your results only by: Marland Kitchen MyChart Message (if you have MyChart) OR . A paper copy in the mail If you have any lab test that is abnormal or we need to change your treatment, we will call you to review the results.   Testing/Procedures: We have put in an order for you to have a CT Calcium score in [place]. They will call you to schedule this appointment.    Follow-Up: At [facility], you and your health needs are our priority.  As part of our continuing mission to provide you with exceptional heart care, we have created designated Provider Care Teams.  These Care Teams include your primary Cardiologist (physician) and Advanced Practice Providers (APPs -  Physician Assistants and Nurse Practitioners) who all work together to provide you with the care you need, when you need it.  We recommend signing up for the patient portal called "MyChart".  Sign up information is provided on this After Visit Summary.  MyChart is used to connect with patients for Virtual Visits (Telemedicine).  Patients are able to view lab/test results, encounter notes, upcoming appointments, etc.  Non-urgent messages can be sent to your provider as well.   To learn more about what you can do with MyChart, go to [email or web address].    Your next appointment:   6 week(s)  The format for your next appointment:   In Person  Provider:   Norman Herrlich, MD   Other Instructions

## 2020-05-01 LAB — LIPOPROTEIN A (LPA): Lipoprotein (a): 15.4 nmol/L (ref <75.0)

## 2020-05-02 ENCOUNTER — Telehealth: Payer: Self-pay

## 2020-05-02 NOTE — Telephone Encounter (Signed)
Spoke with patient regarding results and recommendation.  Patient verbalizes understanding and is agreeable to plan of care. Advised patient to call back with any issues or concerns.  

## 2020-05-06 ENCOUNTER — Ambulatory Visit (INDEPENDENT_AMBULATORY_CARE_PROVIDER_SITE_OTHER)
Admission: RE | Admit: 2020-05-06 | Discharge: 2020-05-06 | Disposition: A | Discharge status: Home / Self Care | Payer: Self-pay | Source: Ambulatory Visit | Attending: Cardiology | Admitting: Cardiology

## 2020-05-06 ENCOUNTER — Other Ambulatory Visit: Payer: Self-pay

## 2020-05-06 DIAGNOSIS — R079 Chest pain, unspecified: Secondary | ICD-10-CM

## 2020-05-10 ENCOUNTER — Telehealth: Payer: Self-pay

## 2020-05-10 NOTE — Telephone Encounter (Signed)
Spoke with patients mother regarding results and recommendation.  She verbalizes understanding and is agreeable to plan of care. Advised patient to call back with any issues or concerns.  

## 2020-06-02 ENCOUNTER — Ambulatory Visit (INDEPENDENT_AMBULATORY_CARE_PROVIDER_SITE_OTHER): Payer: No Typology Code available for payment source

## 2020-06-02 DIAGNOSIS — L501 Idiopathic urticaria: Secondary | ICD-10-CM

## 2020-06-08 NOTE — Progress Notes (Signed)
Cardiology Office Note:    Date:  06/09/2020   ID:  Brian Rangel, DOB 09/07/1981, MRN [id number]  PCP:  Nonnie Done., MD  Cardiologist:  Norman Herrlich, MD    Referring MD: Nonnie Done., MD    ASSESSMENT:    1. Chest pain of uncertain etiology   2. Pure hypercholesterolemia   3. Statin intolerance    PLAN:    In order of problems listed above:  1. Coronary calcium score of 0 not having ischemic symptoms at this time I would do further evaluation regarding CAD. 2. Statin intolerance he will start Zetia 10 mg daily if tolerated check lipids in about 6 weeks if additional therapy be needed consider PCSK9 inhibitor 3. He is having resting tachycardia related to his nonsteroidal anti-inflammatory drug   Next appointment: 1 year   Medication Adjustments/Labs and Tests Ordered: Current medicines are reviewed at length with the patient today.  Concerns regarding medicines are outlined above.  Orders Placed This Encounter  Procedures  . Lipid Profile  . EKG 12-Lead   Meds ordered this encounter  Medications  . ezetimibe (ZETIA) 10 MG tablet    Sig: Take 1 tablet (10 mg total) by mouth daily.    Dispense:  90 tablet    Refill:  3    No chief complaint on file.   History of Present Illness:    Brian Rangel is a 39 y.o. male with a hx of hyperlipidemia and chest pain last seen 04/29/2020.  Following that visit cardiac CT calcium score performed which was 0.  Compliance with diet, lifestyle and medications: Yes  Chart review in epic shows he has a history of chronic idiopathic urticaria.  He was seen in the emergency room at [facility] 04/01/2020 with complaint of chest pain CBC was normal hemoglobin of 15.0 BMP was normal except for past potassium 3.0 his EKG showed sinus rhythm and was normal.  His discharge diagnosis was an adverse reaction to statin medication. Records primary care note that he has significant dyslipidemia LDL of 167 and have been initiated  on a atorvastatin as an outpatient that he had palpitation and was diagnosed with paroxysmal atrial tachycardia.  His family history is noteworthy for CAD father Other labs performed 03/11/2020 shows a cholesterol of 236 triglycerides 134 HDL 45 LDL 167 with non-HDL cholesterol of greater than 190.  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  He continues to have neck pain is seeing a neurologist and is aware of his abnormal MR. Home blood pressures are consistently less than 120-130/80. He is having no cardiovascular symptoms of chest pain shortness of breath palpitation or syncope.  I reviewed his coronary calcium score of 0 he is having no ischemic symptoms at this time I would not do further evaluation.  He has baseline severe dyslipidemia is interested in treatment and we decided to use nonstatin Zetia 10 mg daily with follow-up lipids in about 6 weeks.  I told him in this circumstance I like to see people about 1 time a year.  I would not challenge him with a statin again. Past Medical History:  Diagnosis Date  . Hyperlipidemia   . Urticaria     Past Surgical History:  Procedure Laterality Date  . WISDOM TOOTH EXTRACTION      Current Medications: No outpatient medications have been marked as taking for the 06/09/20 encounter (Office Visit) with Norman Herrlich  J, MD.   Current Facility-Administered Medications for the 06/09/20 encounter (Office Visit) with Baldo Daub, MD  Medication  . omalizumab [name]) injection 150 mg  . omalizumab [name]) injection 150 mg     Allergies:   Patient has no known allergies.   Social History   Socioeconomic History  . Marital status: Single    Spouse name: Not on file  . Number of children: Not on file  . Years of education: Not on file  . Highest education level: Not on file  Occupational History  . Not on file  Tobacco Use    . Smoking status: Light Tobacco Smoker  . Smokeless tobacco: Never Used  Substance and Sexual Activity  . Alcohol use: Yes  . Drug use: No  . Sexual activity: Not on file  Other Topics Concern  . Not on file  Social History Narrative  . Not on file   Social Determinants of Health   Financial Resource Strain:   . Difficulty of Paying Living Expenses:   Food Insecurity:   . Worried About Programme researcher, broadcasting/film/video in the Last Year:   . Barista in the Last Year:   Transportation Needs:   . Freight forwarder (Medical):   Marland Kitchen Lack of Transportation (Non-Medical):   Physical Activity:   . Days of Exercise per Week:   . Minutes of Exercise per Session:   Stress:   . Feeling of Stress :   Social Connections:   . Frequency of Communication with Friends and Family:   . Frequency of Social Gatherings with Friends and Family:   . Attends Religious Services:   . Active Member of Clubs or Organizations:   . Attends Banker Meetings:   Marland Kitchen Marital Status:      Family History: The patient's family history includes CAD in his father. ROS:   Please see the history of present illness.    All other systems reviewed and are negative.  EKGs/Labs/Other Studies Reviewed:    The following studies were reviewed today:  EKG:  EKG ordered today and personally reviewed.  The ekg ordered today demonstrates sinus tachycardia otherwise normal EKG    Physical Exam:    VS:  There were no vitals taken for this visit.    Wt Readings from Last 3 Encounters:  04/29/20 200 lb (90.7 kg)  01/11/20 215 lb 3.2 oz (97.6 kg)  04/15/17 205 lb (93 kg)    Blood pressure by me sitting 140/92 standing 130/94 GEN: He has no xanthoma or xanthelasma well nourished, well developed in no acute distress HEENT: Normal NECK: No JVD; No carotid bruits LYMPHATICS: No lymphadenopathy CARDIAC: RRR, no murmurs, rubs, gallops RESPIRATORY:  Clear to auscultation without rales, wheezing or rhonchi   ABDOMEN: Soft, non-tender, non-distended MUSCULOSKELETAL:  No edema; No deformity  SKIN: Warm and dry NEUROLOGIC:  Alert and oriented x 3 PSYCHIATRIC:  Normal affect    Signed, Norman Herrlich, MD  06/09/2020 9:47 AM    [facility]

## 2020-06-09 ENCOUNTER — Encounter: Payer: Self-pay | Admitting: Cardiology

## 2020-06-09 ENCOUNTER — Other Ambulatory Visit: Payer: Self-pay

## 2020-06-09 ENCOUNTER — Ambulatory Visit (INDEPENDENT_AMBULATORY_CARE_PROVIDER_SITE_OTHER): Payer: No Typology Code available for payment source | Admitting: Cardiology

## 2020-06-09 VITALS — BP 140/98 | HR 138 | Ht 71.0 in | Wt 201.0 lb

## 2020-06-09 DIAGNOSIS — R079 Chest pain, unspecified: Secondary | ICD-10-CM

## 2020-06-09 DIAGNOSIS — Z789 Other specified health status: Secondary | ICD-10-CM | POA: Diagnosis not present

## 2020-06-09 DIAGNOSIS — E7800 Pure hypercholesterolemia, unspecified: Secondary | ICD-10-CM | POA: Diagnosis not present

## 2020-06-09 MED ORDER — EZETIMIBE 10 MG PO TABS
10.0000 mg | ORAL_TABLET | Freq: Every day | ORAL | 3 refills | Status: DC
Start: 2020-06-09 — End: 2021-06-16

## 2020-06-09 NOTE — Patient Instructions (Signed)
Medication Instructions:  Your physician has recommended you make the following change in your medication:  START: Zetia 10 mg take one tablet by mouth daily.  *If you need a refill on your cardiac medications before your next appointment, please call your pharmacy*   Lab Work: Your physician recommends that you return for lab work in: 6 weeks Lipids If you have labs (blood work) drawn today and your tests are completely normal, you will receive your results only by: Marland Kitchen MyChart Message (if you have MyChart) OR . A paper copy in the mail If you have any lab test that is abnormal or we need to change your treatment, we will call you to review the results.   Testing/Procedures: None   Follow-Up: At [facility], you and your health needs are our priority.  As part of our continuing mission to provide you with exceptional heart care, we have created designated Provider Care Teams.  These Care Teams include your primary Cardiologist (physician) and Advanced Practice Providers (APPs -  Physician Assistants and Nurse Practitioners) who all work together to provide you with the care you need, when you need it.  We recommend signing up for the patient portal called "MyChart".  Sign up information is provided on this After Visit Summary.  MyChart is used to connect with patients for Virtual Visits (Telemedicine).  Patients are able to view lab/test results, encounter notes, upcoming appointments, etc.  Non-urgent messages can be sent to your provider as well.   To learn more about what you can do with MyChart, go to [email or web address].    Your next appointment:   1 year(s)  The format for your next appointment:   In Person  Provider:   Norman Herrlich, MD   Other Instructions

## 2020-07-21 ENCOUNTER — Other Ambulatory Visit: Payer: Self-pay

## 2020-07-21 ENCOUNTER — Ambulatory Visit (INDEPENDENT_AMBULATORY_CARE_PROVIDER_SITE_OTHER): Payer: No Typology Code available for payment source | Admitting: *Deleted

## 2020-07-21 DIAGNOSIS — L501 Idiopathic urticaria: Secondary | ICD-10-CM | POA: Diagnosis not present

## 2020-07-25 ENCOUNTER — Other Ambulatory Visit: Payer: Self-pay

## 2020-07-25 DIAGNOSIS — E7800 Pure hypercholesterolemia, unspecified: Secondary | ICD-10-CM

## 2020-07-25 DIAGNOSIS — R079 Chest pain, unspecified: Secondary | ICD-10-CM

## 2020-07-25 DIAGNOSIS — Z789 Other specified health status: Secondary | ICD-10-CM

## 2020-07-25 LAB — LIPID PANEL
Chol/HDL Ratio: 3.8 ratio (ref 0.0–5.0)
Cholesterol, Total: 193 mg/dL (ref 100–199)
HDL: 51 mg/dL (ref >39)
LDL Chol Calc (NIH): 120 mg/dL — ABNORMAL HIGH (ref 0–99)
Triglycerides: 123 mg/dL (ref 0–149)
VLDL Cholesterol Cal: 22 mg/dL (ref 5–40)

## 2020-07-28 ENCOUNTER — Ambulatory Visit: Payer: No Typology Code available for payment source

## 2020-09-15 ENCOUNTER — Ambulatory Visit: Payer: No Typology Code available for payment source

## 2020-09-22 ENCOUNTER — Ambulatory Visit (INDEPENDENT_AMBULATORY_CARE_PROVIDER_SITE_OTHER): Payer: No Typology Code available for payment source

## 2020-09-22 DIAGNOSIS — L501 Idiopathic urticaria: Secondary | ICD-10-CM | POA: Diagnosis not present

## 2020-11-17 ENCOUNTER — Ambulatory Visit: Payer: No Typology Code available for payment source

## 2020-12-07 ENCOUNTER — Ambulatory Visit (INDEPENDENT_AMBULATORY_CARE_PROVIDER_SITE_OTHER): Payer: No Typology Code available for payment source

## 2020-12-07 DIAGNOSIS — L501 Idiopathic urticaria: Secondary | ICD-10-CM | POA: Diagnosis not present

## 2021-01-19 ENCOUNTER — Other Ambulatory Visit: Payer: Self-pay | Admitting: *Deleted

## 2021-01-19 DIAGNOSIS — R079 Chest pain, unspecified: Secondary | ICD-10-CM

## 2021-01-19 MED ORDER — OMALIZUMAB 150 MG ~~LOC~~ SOLR: 300.0000 mg | SUBCUTANEOUS | 11 refills | Status: DC

## 2021-02-01 ENCOUNTER — Ambulatory Visit (INDEPENDENT_AMBULATORY_CARE_PROVIDER_SITE_OTHER): Payer: No Typology Code available for payment source

## 2021-02-01 DIAGNOSIS — L501 Idiopathic urticaria: Secondary | ICD-10-CM | POA: Diagnosis not present

## 2021-03-29 ENCOUNTER — Ambulatory Visit (INDEPENDENT_AMBULATORY_CARE_PROVIDER_SITE_OTHER): Payer: No Typology Code available for payment source

## 2021-03-29 DIAGNOSIS — L501 Idiopathic urticaria: Secondary | ICD-10-CM | POA: Diagnosis not present

## 2021-05-02 ENCOUNTER — Encounter: Payer: Self-pay | Admitting: Neurology

## 2021-05-02 ENCOUNTER — Ambulatory Visit (INDEPENDENT_AMBULATORY_CARE_PROVIDER_SITE_OTHER): Payer: No Typology Code available for payment source | Admitting: Neurology

## 2021-05-02 VITALS — BP 149/102 | HR 116 | Ht 71.0 in | Wt 208.0 lb

## 2021-05-02 DIAGNOSIS — G4489 Other headache syndrome: Secondary | ICD-10-CM

## 2021-05-02 MED ORDER — ZONISAMIDE 100 MG PO CAPS: 100.0000 mg | ORAL_CAPSULE | Freq: Every day | ORAL | 5 refills | Status: DC

## 2021-05-02 NOTE — Progress Notes (Signed)
[facility]  PATIENT: Brian Rangel DOB: [birth year]  REFERRING DOCTOR OR PCP:  Cheri Rous SOURCE: Patient, imaging report, MRI images personally reviewed.  _________________________________   HISTORICAL  CHIEF COMPLAINT:  Chief Complaint  Patient presents with   New Patient (Initial Visit)    RM 13, alone. Paper referral from Cheri Rous, MD for new daily headaches, abnormal MRI brain. Has had headaches off and on for years. Had covid 11/2019. Lost smell/taste, no other sx. End of March 2021, he started having head pressure like [place] was in the [place].  He works in a pharmacy. Cardiologist/eye doctor cleared him. Worsens throughout the day. Laying down helps improve headache. Sx subsided end of last year. He is concerned about ongoing sx. Wondering if it is post covid sx or other.   Other    Initially went to [facility] for evaluation. Saw neurologist there and then they left. He saw another provider after but he wanted another opinion. They told him it was heart issue/cholesterol. Unable to tell him if its covid related or not d/t not a lot of research out. PCP referred him to our office.    HISTORY OF PRESENT ILLNESS: I had the pleasure of seeing patient, Brian Rangel, at [facility] for neurologic consultation regarding his new onset daily headaches.  He is a 40 year old man who had Covid-19 in January 2021.  He loss sense of taste and smell but had no respiratory issues.   He began to experience abnormal sensations in the occiput shortly after the infection.   The sensation is pressure like more than painful and the intensity would fluctuate.   By the end of last year, he was doing better and was only having headache twice a week.  Bright lights sometimes worsened the sensation.   He did not have nay nausea or phonophobia.   Movements did not alter the pain.    He had the first and second Moderna Covid vaccination in January 2022.   After the second one, he had the reoccurrence of the uncomfortable pressure sensation. Initially this was back to daily but over the past couple months it has occurred 2-3 times a week.   The episodes last 15-30 minutes to 2-3 hours.   He denies nausea.   The headaches are better if he lays down.    The sensation is worse in hot weather.    Currently he is not having pain.       He has seen ophthalmology last year was normal.      He originally saw a neurologist, Dr. Tyler Deis, in [facility] [place].  He was placed on nortriptyline and titrated to 25 mg once or twice a day.   He saw another doctor and was prescribed Aimovig.   The nortriptyline was discontinued.   He did not take the Aimovig as his headaches have not had migrainous features.     I personally reviewed the MRI of the brain 06/01/2020.  It showed a few punctate T2/FLAIR hyperintense foci in the subcortical or deep white matter.  None of these appear to be acute.  The sagittal suture appear to be prominent.    REVIEW OF SYSTEMS: Constitutional: No fevers, chills, sweats, or change in appetite Eyes: No visual changes, double vision, eye pain Ear, nose and throat: No hearing loss, ear pain, nasal congestion, sore throat Cardiovascular: No chest pain, palpitations Respiratory:  No shortness of breath at rest or with exertion.   No  wheezes GastrointestinaI: No nausea, vomiting, diarrhea, abdominal pain, fecal incontinence Genitourinary:  No dysuria, urinary retention or frequency.  No nocturia. Musculoskeletal:  No neck pain, back pain Integumentary: No rash, pruritus, skin lesions Neurological: as above Psychiatric: No depression at this time.  No anxiety Endocrine: No palpitations, diaphoresis, change in appetite, change in weigh or increased thirst Hematologic/Lymphatic:  No anemia, purpura, petechiae. Allergic/Immunologic: He has seasonal allergies.  ALLERGIES: Allergies  Allergen Reactions   Atorvastatin     Joint pain,  chest pain    HOME MEDICATIONS:  Current Outpatient Medications:    EPINEPHrine 0.3 mg/0.3 mL IJ SOAJ injection, Use as directed for life-threatening allergic reaction., Disp: , Rfl:    omalizumab (XOLAIR) 150 MG injection, Inject 300 mg into the skin every 28 (twenty-eight) days. Every other month, Disp: 2 each, Rfl: 11   Water For Injection Sterile (STERILE WATER, PRESERVATIVE FREE,) injection, USE AS DIRECTED, Disp: 1000 mL, Rfl: 10   zonisamide (ZONEGRAN) 100 MG capsule, Take 1 capsule (100 mg total) by mouth daily., Disp: 30 capsule, Rfl: 5   ezetimibe (ZETIA) 10 MG tablet, Take 1 tablet (10 mg total) by mouth daily., Disp: 90 tablet, Rfl: 3  Current Facility-Administered Medications:    omalizumab [name]) injection 150 mg, 150 mg, Subcutaneous, Q28 days, Kozlow, Alvira Philips, MD, 150 mg at 07/21/20 1629   omalizumab [name]) injection 150 mg, 150 mg, Subcutaneous, Q8 Weeks, Kozlow, Alvira Philips, MD, 150 mg at 03/29/21 1408  PAST MEDICAL HISTORY: Past Medical History:  Diagnosis Date   Hyperlipidemia    Urticaria     PAST SURGICAL HISTORY: Past Surgical History:  Procedure Laterality Date   WISDOM TOOTH EXTRACTION      FAMILY HISTORY: Family History  Problem Relation Age of Onset   Thyroid disease Mother    Hyperlipidemia Mother    CAD Father    Hyperlipidemia Maternal Grandmother    Diabetes Mellitus II Maternal Grandmother    Lung cancer Maternal Grandfather    Alzheimer's disease Paternal Grandmother    Diabetes Mellitus II Paternal Grandfather     SOCIAL HISTORY:  Social History   Socioeconomic History   Marital status: Single    Spouse name: Not on file   Number of children: 0   Years of education: Not on file   Highest education level: Not on file  Occupational History   Not on file  Tobacco Use   Smoking status: Light Smoker    Pack years: 0.00   Smokeless tobacco: Never  Substance and Sexual Activity   Alcohol use: Yes    Comment: socially   Drug use: No    Sexual activity: Not on file  Other Topics Concern   Not on file  Social History Narrative   Not on file   Social Determinants of Health   Financial Resource Strain: Not on file  Food Insecurity: Not on file  Transportation Needs: Not on file  Physical Activity: Not on file  Stress: Not on file  Social Connections: Not on file  Intimate Partner Violence: Not on file     PHYSICAL EXAM  Vitals:   05/02/21 1535  BP: (!) 149/102  Pulse: (!) 116  Weight: 208 lb (94.3 kg)  Height: 5\' 11"  (1.803 m)    Body mass index is 29.01 kg/m.   General: The patient is well-developed and well-nourished and in no acute distress  HEENT:  Head is [place]/AT.  Sclera are anicteric.  Funduscopic exam shows normal optic discs and retinal vessels.  Neck: No carotid bruits are noted.  The neck is nontender.  Cardiovascular: The heart has a regular rate and rhythm with a normal S1 and S2. There were no murmurs, gallops or rubs.    Skin: Extremities are without rash or  edema.  Musculoskeletal:  Back is nontender  Neurologic Exam  Mental status: The patient is alert and oriented x 3 at the time of the examination. The patient has apparent normal recent and remote memory, with an apparently normal attention span and concentration ability.   Speech is normal.  Cranial nerves: Extraocular movements are full. Pupils are equal, round, and reactive to light and accomodation.  Visual fields are full.  Facial symmetry is present. There is good facial sensation to soft touch bilaterally.Facial strength is normal.  Trapezius and sternocleidomastoid strength is normal. No dysarthria is noted.  The tongue is midline, and the patient has symmetric elevation of the soft palate. No obvious hearing deficits are noted.  Motor:  Muscle bulk is normal.   Tone is normal. Strength is  5 / 5 in all 4 extremities.   Sensory: Sensory testing is intact to pinprick, soft touch and vibration sensation in all 4  extremities.  Coordination: Cerebellar testing reveals good finger-nose-finger and heel-to-shin bilaterally.  Gait and station: Station is normal.   Gait is normal. Tandem gait is normal. Romberg is negative.   Reflexes: Deep tendon reflexes are symmetric and normal bilaterally.   Plantar responses are flexor.    DIAGNOSTIC DATA (LABS, IMAGING, TESTING) - I reviewed patient records, labs, notes, testing and imaging myself where available.  Lab Results  Component Value Date   WBC 7.5 06/11/2017   HGB 16.7 06/11/2017   HCT 48.6 06/11/2017   MCV 96 06/11/2017   PLT 175 06/11/2017      Component Value Date/Time   NA 143 06/11/2017 1417   K 4.1 06/11/2017 1417   CL 102 06/11/2017 1417   CO2 22 06/11/2017 1417   GLUCOSE 99 06/11/2017 1417   BUN 11 06/11/2017 1417   CREATININE 0.93 06/11/2017 1417   CALCIUM 9.5 06/11/2017 1417   PROT 7.0 06/11/2017 1417   ALBUMIN 4.6 06/11/2017 1417   AST 55 (H) 06/11/2017 1417   ALT 77 (H) 06/11/2017 1417   ALKPHOS 71 06/11/2017 1417   BILITOT 0.6 06/11/2017 1417   GFRNONAA 104 06/11/2017 1417   GFRAA 120 06/11/2017 1417   Lab Results  Component Value Date   CHOL 193 07/25/2020   HDL 51 07/25/2020   LDLCALC 120 (H) 07/25/2020   TRIG 123 07/25/2020   CHOLHDL 3.8 07/25/2020        ASSESSMENT AND PLAN  Other headache syndrome   In summary, Mr. Cheree DittoGraham is a 40 year old woman who has had occipital pressure-like sensations and has had over the past year and a half, most recently intensified after he had his COVID vaccination.  The sensation is more pressure-like and would not be typical for migraines.  The etiology is uncertain.  I do not think that the changes that were seen in the brain MRI last year are related.  Since he is still having the spells on a regular basis, I will have him try zonisamide 100 mg as it can be beneficial for both migraine headaches and atypical headaches for those with mixed tension type qualities.  He has had  some benefit on the nortriptyline and I would consider a different tricyclic if the zonisamide is not helpful.  I would not recommend reimaging at  this time.  However, if his headaches worsen then it would be reasonable to obtain a CT scan to be certain that the midline changes (likely represent a prominent frontal suture) are not pathologic.  He will return to see [place] in several months or sooner if there are new or worsening neurologic symptoms.  He is advised to call us after 6 weeks if the headaches are no better.  Thank you for asking to see Mr. Platten.  Please let me know if I can be of further assistance with him or other patients in the future.     Saivion Goettel A. Epimenio Foot, MD, [facility] 05/02/2021, 10:01 PM Certified in Neurology, Clinical Neurophysiology, Sleep Medicine and Neuroimaging  [facility] [address] [place], [place] [address] [phone]

## 2021-05-24 ENCOUNTER — Ambulatory Visit (INDEPENDENT_AMBULATORY_CARE_PROVIDER_SITE_OTHER): Payer: No Typology Code available for payment source | Admitting: *Deleted

## 2021-05-24 ENCOUNTER — Other Ambulatory Visit: Payer: Self-pay

## 2021-05-24 DIAGNOSIS — L501 Idiopathic urticaria: Secondary | ICD-10-CM | POA: Diagnosis not present

## 2021-06-06 ENCOUNTER — Telehealth: Payer: Self-pay | Admitting: Neurology

## 2021-06-06 MED ORDER — IMIPRAMINE HCL 25 MG PO TABS: 25.0000 mg | ORAL_TABLET | Freq: Every day | ORAL | 3 refills | Status: DC

## 2021-06-06 NOTE — Telephone Encounter (Addendum)
Called pt back. Light headedness started week three of taking zonegran 100mg  po qhs. Occurs daily throughout the day. Two coworkers noticed he was having SE. Told him he was blinking a lot last week. Has some dizzy spell/fatigue as well. He works at . Bends over a lot. Wanting to know what Dr. [employer] recommends. Advised I will speak w/ MD and call back.   Spoke w Dr. Epimenio Foot. He recommends he stop zonegran and try Imipramine 25mg  po qhs #30, 5 refills instead. I called pt. Relayed Dr. Epimenio Foot recommendation. He is agreeable to plan. I e-scribed imipramine to [id number] - [place], [place] - [address]

## 2021-06-06 NOTE — Telephone Encounter (Signed)
Pt states re: week 4 on zonisamide (ZONEGRAN) 100 MG capsule he feels light headed, he wants to know if this is to be expected or not.

## 2021-06-15 ENCOUNTER — Ambulatory Visit: Payer: No Typology Code available for payment source | Admitting: Allergy and Immunology

## 2021-06-16 ENCOUNTER — Other Ambulatory Visit: Payer: Self-pay | Admitting: Cardiology

## 2021-06-16 DIAGNOSIS — R079 Chest pain, unspecified: Secondary | ICD-10-CM

## 2021-06-16 DIAGNOSIS — Z789 Other specified health status: Secondary | ICD-10-CM

## 2021-06-16 DIAGNOSIS — E7800 Pure hypercholesterolemia, unspecified: Secondary | ICD-10-CM

## 2021-06-19 ENCOUNTER — Other Ambulatory Visit: Payer: Self-pay

## 2021-06-19 ENCOUNTER — Encounter: Payer: Self-pay | Admitting: Allergy and Immunology

## 2021-06-19 ENCOUNTER — Ambulatory Visit (INDEPENDENT_AMBULATORY_CARE_PROVIDER_SITE_OTHER): Payer: No Typology Code available for payment source | Admitting: Allergy and Immunology

## 2021-06-19 VITALS — BP 108/70 | HR 123 | Temp 98.1°F | Resp 23 | Ht 70.0 in | Wt 212.0 lb

## 2021-06-19 DIAGNOSIS — L501 Idiopathic urticaria: Secondary | ICD-10-CM

## 2021-06-19 NOTE — Patient Instructions (Signed)
  1.  Continue omalizumab 150 mg every 4-8 weeks  2.  Continue Epi-Pen if needed  3.  Return to clinic in 1 year or earlier if problem  4. Obtain flu  vaccine

## 2021-06-19 NOTE — Progress Notes (Signed)
[place] [facility] [place] [facility] [place] [facility] [place] [facility] [place]   Follow-up Note  Referring Provider: Nonnie Done., MD Primary Provider: Nonnie Done., MD Date of Office Visit: 06/19/2021  Subjective:   Brian Rangel (DOB: [birth year]) is a 40 y.o. male who [facility] in re-evaluation of the following:  HPI: Brian Rangel returns to this clinic in evaluation of chronic urticaria treated with omalizumab.  His last visit to this clinic was 11 January 2020.  He was able to stretch out his omalizumab administration to every 8 weeks and still has very good control of his urticaria.  He has received 2 COVID vaccines after sustaining a COVID infection in January 2021 without long-term sequela.  Allergies as of 06/19/2021       Reactions   Atorvastatin    Joint pain, chest pain   Zonisamide    Light-headed, dizzy spells        Medication List    EPINEPHrine 0.3 mg/0.3 mL Soaj injection Commonly known as: EPI-PEN Use as directed for life-threatening allergic reaction.   ezetimibe 10 MG tablet Commonly known as: ZETIA TAKE 1 TABLET BY MOUTH EVERY DAY   imipramine 25 MG tablet Commonly known as: Tofranil Take 1 tablet (25 mg total) by mouth at bedtime.   omalizumab 150 MG injection Commonly known as: XOLAIR Inject 300 mg into the skin every 28 (twenty-eight) days. Every other month   sterile water (preservative free) injection USE AS DIRECTED    Past Medical History:  Diagnosis Date   Hyperlipidemia    Urticaria     Past Surgical History:  Procedure Laterality Date   WISDOM TOOTH EXTRACTION      Review of systems negative except as noted in HPI / PMHx or noted below:  Review of Systems  Constitutional: Negative.   HENT: Negative.    Eyes: Negative.   Respiratory: Negative.    Cardiovascular: Negative.   Gastrointestinal: Negative.   Genitourinary: Negative.   Musculoskeletal: Negative.   Skin: Negative.    Neurological: Negative.   Endo/Heme/Allergies: Negative.   Psychiatric/Behavioral: Negative.      Objective:   Vitals:   06/19/21 1112  BP: 108/70  Pulse: (!) 123  Resp: (!) 23  Temp: 98.1 F (36.7 C)  SpO2: 100%   Height: 5\' 10"  (177.8 cm)  Weight: 212 lb (96.2 kg)   Physical Exam Constitutional:      Appearance: He is not diaphoretic.  HENT:     Head: Normocephalic.     Right Ear: Tympanic membrane, ear canal and external ear normal.     Left Ear: Tympanic membrane, ear canal and external ear normal.     Nose: Nose normal. No mucosal edema or rhinorrhea.     Mouth/Throat:     Pharynx: Uvula midline. No oropharyngeal exudate.  Eyes:     Conjunctiva/sclera: Conjunctivae normal.  Neck:     Thyroid: No thyromegaly.     Trachea: Trachea normal. No tracheal tenderness or tracheal deviation.  Cardiovascular:     Rate and Rhythm: Normal rate and regular rhythm.     Heart sounds: Normal heart sounds, S1 normal and S2 normal. No murmur heard. Pulmonary:     Effort: No respiratory distress.     Breath sounds: Normal breath sounds. No stridor. No wheezing or rales.  Lymphadenopathy:     Head:     Right side of head: No tonsillar adenopathy.     Left side of head: No  tonsillar adenopathy.     Cervical: No cervical adenopathy.  Skin:    Findings: No erythema or rash.     Nails: There is no clubbing.  Neurological:     Mental Status: He is alert.    Diagnostics: none  Assessment and Plan:   1. Idiopathic urticaria    1.  Continue omalizumab 150 mg every 4-8 weeks  2.  Continue Epi-Pen if needed  3.  Return to clinic in 1 year or earlier if problem  4. Obtain flu  vaccine  Brian Rangel appears to be doing quite well using omalizumab currently at every 8 weeks.  He does have the option to attempt to stretch out the interval at every 12 weeks and we had a discussion about this medical manipulation today.  If he does well I will see him back in this clinic in 1 year or  earlier if there is a problem.  Laurette Schimke, MD Allergy / Immunology [facility]

## 2021-06-20 ENCOUNTER — Encounter: Payer: Self-pay | Admitting: Allergy and Immunology

## 2021-06-28 ENCOUNTER — Other Ambulatory Visit: Payer: Self-pay | Admitting: Neurology

## 2021-07-19 ENCOUNTER — Ambulatory Visit (INDEPENDENT_AMBULATORY_CARE_PROVIDER_SITE_OTHER): Payer: No Typology Code available for payment source | Admitting: *Deleted

## 2021-07-19 ENCOUNTER — Other Ambulatory Visit: Payer: Self-pay

## 2021-07-19 DIAGNOSIS — L501 Idiopathic urticaria: Secondary | ICD-10-CM

## 2021-08-02 ENCOUNTER — Encounter: Payer: Self-pay | Admitting: Neurology

## 2021-08-02 ENCOUNTER — Ambulatory Visit (INDEPENDENT_AMBULATORY_CARE_PROVIDER_SITE_OTHER): Payer: No Typology Code available for payment source | Admitting: Neurology

## 2021-08-02 VITALS — BP 158/107 | HR 114 | Ht 70.0 in | Wt 207.0 lb

## 2021-08-02 DIAGNOSIS — G4489 Other headache syndrome: Secondary | ICD-10-CM | POA: Diagnosis not present

## 2021-08-02 MED ORDER — IMIPRAMINE HCL 25 MG PO TABS: ORAL_TABLET | ORAL | 3 refills | Status: DC

## 2021-08-02 NOTE — Progress Notes (Signed)
[facility]  PATIENT: Brian Rangel DOB: [birth year]  REFERRING DOCTOR OR PCP:  Brian Rangel SOURCE: Patient, imaging report, MRI images personally reviewed.  _________________________________   HISTORICAL  CHIEF COMPLAINT:  Chief Complaint  Patient presents with   Follow-up    Pt alone, rm 1 he was started on zonegran which caused dizzy. Was started on imipramine which has helped but there are still some problems when he is still. Had eyes checked and his eyes are ok.     HISTORY OF PRESENT ILLNESS: Brian Rangel, at [facility] for neurologic consultation regarding his new onset daily headaches.   Update 08/02/2021 We tried zonisamide for his headaches but he felt dizzy.    We switched to imipramine 25 mg.   He started 6 weeks ago.   He is tolerating it well    Headaches are doing better.   Currently,He gets some HA pain daily but the intensity is much lower.    He has more when he is sitting down than other positions.    He denies any visual change though pain is often in the eyes.   He has seen ophthalmology.    He works as a Fish farm manager.    He is going to school to be a Marine scientist.      History of HA He ihad Covid-19 in January 2021.  He loss sense of taste and smell but had no respiratory issues.   He began to experience abnormal sensations in the occiput shortly after the infection.   The sensation is pressure like more than painful and the intensity would fluctuate.   By the end of last year, he was doing better and was only having headache twice a week.  Bright lights sometimes worsened the sensation.   He did not have nay nausea or phonophobia.   Movements did not alter the pain.    He had the first and second Moderna Covid vaccination in January 2022.  After the second one, he had the reoccurrence of the uncomfortable pressure sensation. Initially this was back to daily but over the past couple months it has occurred 2-3 times a  week.   The episodes last 15-30 minutes to 2-3 hours.   He denies nausea.   The headaches are better if he lays down.    The sensation is worse in hot weather.    Currently he is not having pain.       He originally saw a neurologist, Dr. Tyler Rangel, in [facility] at [place].  He was placed on nortriptyline and titrated to 25 mg once or twice a day.   He saw another doctor and was prescribed Aimovig.   The nortriptyline was discontinued.   He did not take the Aimovig as his headaches have not had migrainous features.     IMAGING: MRI of the brain 06/01/2020.  It showed a few punctate T2/FLAIR hyperintense foci in the subcortical or deep white matter.  None of these appear to be acute.  The sagittal suture appear to be prominent.    REVIEW OF SYSTEMS: Constitutional: No fevers, chills, sweats, or change in appetite Eyes: No visual changes, double vision, eye pain Ear, nose and throat: No hearing loss, ear pain, nasal congestion, sore throat Cardiovascular: No chest pain, palpitations Respiratory:  No shortness of breath at rest or with exertion.   No wheezes GastrointestinaI: No nausea, vomiting, diarrhea, abdominal pain, fecal incontinence Genitourinary:  No dysuria, urinary retention or frequency.  No  nocturia. Musculoskeletal:  No neck pain, back pain Integumentary: No rash, pruritus, skin lesions Neurological: as above Psychiatric: No depression at this time.  No anxiety Endocrine: No palpitations, diaphoresis, change in appetite, change in weigh or increased thirst Hematologic/Lymphatic:  No anemia, purpura, petechiae. Allergic/Immunologic: He has seasonal allergies.  ALLERGIES: Allergies  Allergen Reactions   Atorvastatin     Joint pain, chest pain   Zonisamide     Light-headed, dizzy spells    HOME MEDICATIONS:  Current Outpatient Medications:    EPINEPHrine 0.3 mg/0.3 mL IJ SOAJ injection, Use as directed for life-threatening allergic reaction., Disp: , Rfl:    ezetimibe  (ZETIA) 10 MG tablet, TAKE 1 TABLET BY MOUTH EVERY DAY, Disp: 90 tablet, Rfl: 3   omalizumab (XOLAIR) 150 MG injection, Inject 300 mg into the skin every 28 (twenty-eight) days. Every other month, Disp: 2 each, Rfl: 11   Water For Injection Sterile (STERILE WATER, PRESERVATIVE FREE,) injection, USE AS DIRECTED, Disp: 1000 mL, Rfl: 10   imipramine (TOFRANIL) 25 MG tablet, TAKE 1 or 2 TABLET BY MOUTH EVERYDAY AT BEDTIME, Disp: 180 tablet, Rfl: 3  PAST MEDICAL HISTORY: Past Medical History:  Diagnosis Date   Hyperlipidemia    Urticaria     PAST SURGICAL HISTORY: Past Surgical History:  Procedure Laterality Date   WISDOM TOOTH EXTRACTION      FAMILY HISTORY: Family History  Problem Relation Age of Onset   Thyroid disease Mother    Hyperlipidemia Mother    CAD Father    Hyperlipidemia Maternal Grandmother    Diabetes Mellitus II Maternal Grandmother    Lung cancer Maternal Grandfather    Alzheimer's disease Paternal Grandmother    Diabetes Mellitus II Paternal Grandfather     SOCIAL HISTORY:  Social History   Socioeconomic History   Marital status: Single    Spouse name: Not on file   Number of children: 0   Years of education: Not on file   Highest education level: Not on file  Occupational History   Not on file  Tobacco Use   Smoking status: Light Smoker   Smokeless tobacco: Never  Substance and Sexual Activity   Alcohol use: Yes    Comment: socially   Drug use: No   Sexual activity: Not on file  Other Topics Concern   Not on file  Social History Narrative   Not on file   Social Determinants of Health   Financial Resource Strain: Not on file  Food Insecurity: Not on file  Transportation Needs: Not on file  Physical Activity: Not on file  Stress: Not on file  Social Connections: Not on file  Intimate Partner Violence: Not on file     PHYSICAL EXAM  Vitals:   08/02/21 0810  BP: (!) 158/107  Pulse: (!) 114  Weight: 207 lb (93.9 kg)  Height: 5\' 10"   (1.778 m)    Body mass index is 29.7 kg/m.   General: The patient is well-developed and well-nourished and in no acute distress  HEENT:  Head is [place]/AT.  Sclera are anicteric.    Neck: .  The neck is nontender.   Good range of motion.  No tenderness in the paraspinal muscles.  Skin: Extremities are without rash or  edema.  Musculoskeletal:  Back is nontender  Neurologic Exam  Mental status: The patient is alert and oriented x 3 at the time of the examination. The patient has apparent normal recent and remote memory, with an apparently normal attention span and  concentration ability.   Speech is normal.  Cranial nerves: Extraocular movements are full.  Facial strength and sensation was normal.. No obvious hearing deficits are noted.  Motor:  Muscle bulk is normal.   Tone is normal. Strength is  5 / 5 in all 4 extremities.   Sensory: Sensory testing is intact to pinprick, soft touch and vibration sensation in all 4 extremities.  Coordination: Cerebellar testing reveals good finger-nose-finger and heel-to-shin bilaterally.  Gait and station: Station is normal.   Gait is normal. Tandem gait is normal. Romberg is negative.   Reflexes: Deep tendon reflexes are symmetric and normal bilaterally.  Marland Kitchen    DIAGNOSTIC DATA (LABS, IMAGING, TESTING) - I reviewed patient records, labs, notes, testing and imaging myself where available.  Lab Results  Component Value Date   WBC 7.5 06/11/2017   HGB 16.7 06/11/2017   HCT 48.6 06/11/2017   MCV 96 06/11/2017   PLT 175 06/11/2017      Component Value Date/Time   NA 143 06/11/2017 1417   K 4.1 06/11/2017 1417   CL 102 06/11/2017 1417   CO2 22 06/11/2017 1417   GLUCOSE 99 06/11/2017 1417   BUN 11 06/11/2017 1417   CREATININE 0.93 06/11/2017 1417   CALCIUM 9.5 06/11/2017 1417   PROT 7.0 06/11/2017 1417   ALBUMIN 4.6 06/11/2017 1417   AST 55 (H) 06/11/2017 1417   ALT 77 (H) 06/11/2017 1417   ALKPHOS 71 06/11/2017 1417   BILITOT 0.6  06/11/2017 1417   GFRNONAA 104 06/11/2017 1417   GFRAA 120 06/11/2017 1417   Lab Results  Component Value Date   CHOL 193 07/25/2020   HDL 51 07/25/2020   LDLCALC 120 (H) 07/25/2020   TRIG 123 07/25/2020   CHOLHDL 3.8 07/25/2020        ASSESSMENT AND PLAN  Other headache syndrome   Headaches have improved on imipramine.  He tolerates it well.  Since he continues to have some pain we will increase the dose to 50 mg.  However, he should back it down to 25 mg if he has trouble tolerating it. Stay active and exercise as tolerated. Return in 6 months but call sooner if new or worsening neurologic symptoms.  Emerald Gehres A. Epimenio Foot, MD, [facility] 08/02/2021, 4:44 PM Certified in Neurology, Clinical Neurophysiology, Sleep Medicine and Neuroimaging  [facility] [address], [address] [place], [place] [address] [phone]

## 2021-08-08 DIAGNOSIS — L509 Urticaria, unspecified: Secondary | ICD-10-CM | POA: Insufficient documentation

## 2021-08-08 DIAGNOSIS — E785 Hyperlipidemia, unspecified: Secondary | ICD-10-CM | POA: Insufficient documentation

## 2021-08-21 NOTE — Progress Notes (Signed)
Cardiology Office Note:    Date:  08/22/2021   ID:  Brian Rangel, DOB 11-12-1980, MRN [id number]  PCP:  Nonnie Done., MD  Cardiologist:  Norman Herrlich, MD    Referring MD: Nonnie Done., MD    ASSESSMENT:    1. Hyperlipidemia, unspecified hyperlipidemia type   2. Statin intolerance    PLAN:    In order of problems listed above:  Calcium score of 0 we elected lipid-lowering treatment statin intolerant continue Zetia check lipid profile today.  He has lipid profile performed 07/25/2020 total 193 cholesterol LDL 120 triglycerides 123 HDL 50.   Next appointment: 1 year   Medication Adjustments/Labs and Tests Ordered: Current medicines are reviewed at length with the patient today.  Concerns regarding medicines are outlined above.  Orders Placed This Encounter  Procedures   Comprehensive metabolic panel   Lipid panel   Lipoprotein A (LPA)   EKG 12-Lead   No orders of the defined types were placed in this encounter.   No chief complaint on file.   History of Present Illness:    Brian Rangel is a 40 y.o. male with a hx of hyperlipidemia chest pain and coronary calcium score 0 last seen 06/09/2020. Compliance with diet, lifestyle and medications: Yes  He had no further cardiovascular symptoms of chest pain edema shortness of breath or palpitation. He is statin intolerant tolerating Zetia without any muscle symptoms. Past Medical History:  Diagnosis Date   Hyperlipidemia    Urticaria     Past Surgical History:  Procedure Laterality Date   WISDOM TOOTH EXTRACTION      Current Medications: Current Meds  Medication Sig   EPINEPHrine 0.3 mg/0.3 mL IJ SOAJ injection Use as directed for life-threatening allergic reaction.   ezetimibe (ZETIA) 10 MG tablet TAKE 1 TABLET BY MOUTH EVERY DAY   imipramine (TOFRANIL) 25 MG tablet TAKE 1 or 2 TABLET BY MOUTH EVERYDAY AT BEDTIME   omalizumab (XOLAIR) 150 MG injection Inject 300 mg into the skin every 28  (twenty-eight) days. Every other month   Water For Injection Sterile (STERILE WATER, PRESERVATIVE FREE,) injection USE AS DIRECTED     Allergies:   Atorvastatin and Zonisamide   Social History   Socioeconomic History   Marital status: Single    Spouse name: Not on file   Number of children: 0   Years of education: Not on file   Highest education level: Not on file  Occupational History   Not on file  Tobacco Use   Smoking status: Light Smoker   Smokeless tobacco: Never  Substance and Sexual Activity   Alcohol use: Yes    Comment: socially   Drug use: No   Sexual activity: Not on file  Other Topics Concern   Not on file  Social History Narrative   Not on file   Social Determinants of Health   Financial Resource Strain: Not on file  Food Insecurity: Not on file  Transportation Needs: Not on file  Physical Activity: Not on file  Stress: Not on file  Social Connections: Not on file     Family History: The patient's family history includes Alzheimer's disease in his paternal grandmother; CAD in his father; Diabetes Mellitus II in his maternal grandmother and paternal grandfather; Hyperlipidemia in his maternal grandmother and mother; Lung cancer in his maternal grandfather; Thyroid disease in his mother. ROS:   Please see the history of present illness.    All other systems reviewed and are negative.  EKGs/Labs/Other  Studies Reviewed:    The following studies were reviewed today:  EKG:  EKG ordered today and personally reviewed.  The ekg ordered today demonstrates sinus rhythm RSR prime otherwise normal EKG  Recent Labs: No results found for requested labs within last 8760 hours.  Recent Lipid Panel    Component Value Date/Time   CHOL 193 07/25/2020 0832   TRIG 123 07/25/2020 0832   HDL 51 07/25/2020 0832   CHOLHDL 3.8 07/25/2020 0832   LDLCALC 120 (H) 07/25/2020 0832    Physical Exam:    VS:  BP (!) 150/92 (BP Location: Right Arm, Patient Position:  Sitting, Cuff Size: Normal)   Pulse (!) 140   Ht 5\' 10"  (1.778 m)   Wt 203 lb 3.2 oz (92.2 kg)   SpO2 97%   BMI 29.16 kg/m     Wt Readings from Last 3 Encounters:  08/22/21 203 lb 3.2 oz (92.2 kg)  08/02/21 207 lb (93.9 kg)  06/19/21 212 lb (96.2 kg)     GEN:  Well nourished, well developed in no acute distress HEENT: Normal NECK: No JVD; No carotid bruits LYMPHATICS: No lymphadenopathy CARDIAC: RRR, no murmurs, rubs, gallops RESPIRATORY:  Clear to auscultation without rales, wheezing or rhonchi  ABDOMEN: Soft, non-tender, non-distended MUSCULOSKELETAL:  No edema; No deformity  SKIN: Warm and dry NEUROLOGIC:  Alert and oriented x 3 PSYCHIATRIC:  Normal affect    Signed, 06/21/21, MD  08/22/2021 11:51 AM    [facility]

## 2021-08-22 ENCOUNTER — Other Ambulatory Visit: Payer: Self-pay

## 2021-08-22 ENCOUNTER — Ambulatory Visit (INDEPENDENT_AMBULATORY_CARE_PROVIDER_SITE_OTHER): Payer: No Typology Code available for payment source | Admitting: Cardiology

## 2021-08-22 ENCOUNTER — Encounter: Payer: Self-pay | Admitting: Cardiology

## 2021-08-22 VITALS — BP 150/92 | HR 140 | Ht 70.0 in | Wt 203.2 lb

## 2021-08-22 DIAGNOSIS — E785 Hyperlipidemia, unspecified: Secondary | ICD-10-CM | POA: Diagnosis not present

## 2021-08-22 DIAGNOSIS — Z789 Other specified health status: Secondary | ICD-10-CM

## 2021-08-22 NOTE — Patient Instructions (Signed)
Medication Instructions:  Your physician recommends that you continue on your current medications as directed. Please refer to the Current Medication list given to you today.  *If you need a refill on your cardiac medications before your next appointment, please call your pharmacy*   Lab Work: Your physician recommends that you return for lab work in: At your earliest convenience.  Lipids, CMP, Lpa FASTING If you have labs (blood work) drawn today and your tests are completely normal, you will receive your results only by: MyChart Message (if you have MyChart) OR A paper copy in the mail If you have any lab test that is abnormal or we need to change your treatment, we will call you to review the results.   Testing/Procedures: None   Follow-Up: At [facility], you and your health needs are our priority.  As part of our continuing mission to provide you with exceptional heart care, we have created designated Provider Care Teams.  These Care Teams include your primary Cardiologist (physician) and Advanced Practice Providers (APPs -  Physician Assistants and Nurse Practitioners) who all work together to provide you with the care you need, when you need it.  We recommend signing up for the patient portal called "MyChart".  Sign up information is provided on this After Visit Summary.  MyChart is used to connect with patients for Virtual Visits (Telemedicine).  Patients are able to view lab/test results, encounter notes, upcoming appointments, etc.  Non-urgent messages can be sent to your provider as well.   To learn more about what you can do with MyChart, go to [email or web address].    Your next appointment:   1 year(s)  The format for your next appointment:   In Person  Provider:   Norman Herrlich, MD   Other Instructions

## 2021-09-27 ENCOUNTER — Other Ambulatory Visit: Payer: Self-pay

## 2021-09-27 ENCOUNTER — Ambulatory Visit (INDEPENDENT_AMBULATORY_CARE_PROVIDER_SITE_OTHER): Payer: No Typology Code available for payment source | Admitting: *Deleted

## 2021-09-27 DIAGNOSIS — L501 Idiopathic urticaria: Secondary | ICD-10-CM

## 2021-09-27 MED ORDER — OMALIZUMAB 150 MG ~~LOC~~ SOLR
150.0000 mg | SUBCUTANEOUS | Status: AC
  Administered 2021-09-27 – 2023-01-14 (×7): 150 mg via SUBCUTANEOUS

## 2021-09-28 LAB — COMPREHENSIVE METABOLIC PANEL
ALT: 31 IU/L (ref 0–44)
AST: 26 IU/L (ref 0–40)
Albumin/Globulin Ratio: 2.5 — ABNORMAL HIGH (ref 1.2–2.2)
Albumin: 4.9 g/dL (ref 4.0–5.0)
Alkaline Phosphatase: 73 IU/L (ref 44–121)
BUN/Creatinine Ratio: 16 (ref 9–20)
BUN: 13 mg/dL (ref 6–24)
Bilirubin Total: 0.5 mg/dL (ref 0.0–1.2)
CO2: 25 mmol/L (ref 20–29)
Calcium: 9.1 mg/dL (ref 8.7–10.2)
Chloride: 104 mmol/L (ref 96–106)
Creatinine, Ser: 0.82 mg/dL (ref 0.76–1.27)
Globulin, Total: 2 g/dL (ref 1.5–4.5)
Glucose: 94 mg/dL (ref 70–99)
Potassium: 4.5 mmol/L (ref 3.5–5.2)
Sodium: 140 mmol/L (ref 134–144)
Total Protein: 6.9 g/dL (ref 6.0–8.5)
eGFR: 112 mL/min/{1.73_m2} (ref >59)

## 2021-09-28 LAB — LIPID PANEL
Chol/HDL Ratio: 4.2 ratio (ref 0.0–5.0)
Cholesterol, Total: 188 mg/dL (ref 100–199)
HDL: 45 mg/dL (ref >39)
LDL Chol Calc (NIH): 119 mg/dL — ABNORMAL HIGH (ref 0–99)
Triglycerides: 135 mg/dL (ref 0–149)
VLDL Cholesterol Cal: 24 mg/dL (ref 5–40)

## 2021-09-28 LAB — LIPOPROTEIN A (LPA): Lipoprotein (a): 15.1 nmol/L (ref <75.0)

## 2021-10-03 ENCOUNTER — Telehealth: Payer: Self-pay

## 2021-10-03 NOTE — Telephone Encounter (Signed)
-----   Message from Baldo Daub, MD sent at 09/29/2021  2:00 PM EST ----- Good result continue Zetia.  Liver function tests are normal this time

## 2021-10-03 NOTE — Telephone Encounter (Signed)
Left message on patients voicemail to please return our call.   

## 2021-10-04 ENCOUNTER — Telehealth: Payer: Self-pay

## 2021-10-04 NOTE — Telephone Encounter (Signed)
Left message on patients voicemail to please return our call.   Letter mailed to patient at this time.  

## 2021-10-04 NOTE — Telephone Encounter (Signed)
-----   Message from Baldo Daub, MD sent at 09/29/2021  2:00 PM EST ----- Good result continue Zetia.  Liver function tests are normal this time

## 2021-11-05 IMAGING — CT CT CARDIAC CORONARY ARTERY CALCIUM SCORE
3 series · 14 of 20 positions shown, 15 images · non-contrast
Comparison: None.

Addendum:
CLINICAL DATA: Risk stratification

EXAM:
Coronary Calcium Score
TECHNIQUE: The patient was scanned on a Siemens Force scanner. Axial
non-contrast 3 mm slices were carried out through the heart. The
data set was analyzed on a dedicated work station and scored using
the Agatson method.

[Series 2: casc 3.0 bv41 2 bestdiast 65 % · axial · 0.36mm/px · z∈[-240,-165]mm · 4 of 43 slices shown, 5 images]
[im 9/43  vessel]
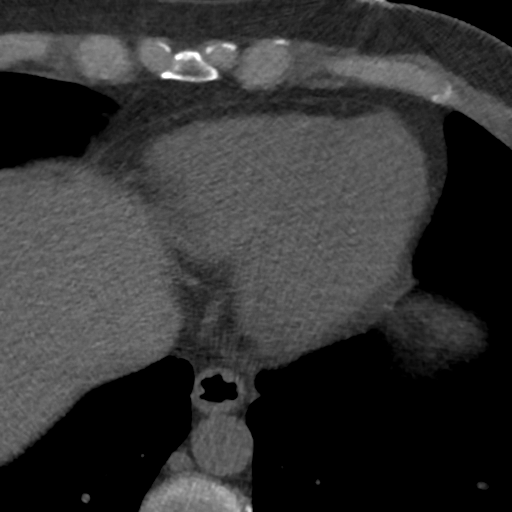
[im 9/43  lung]
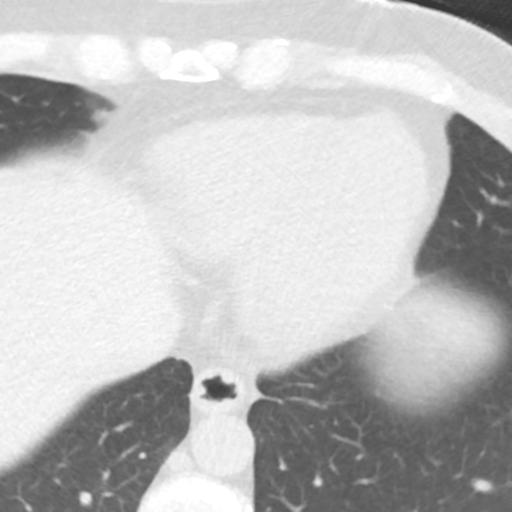
[im 17/43  vessel]
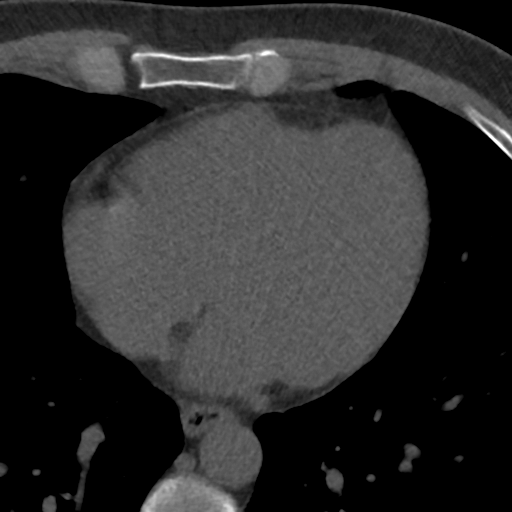
[im 26/43  vessel]
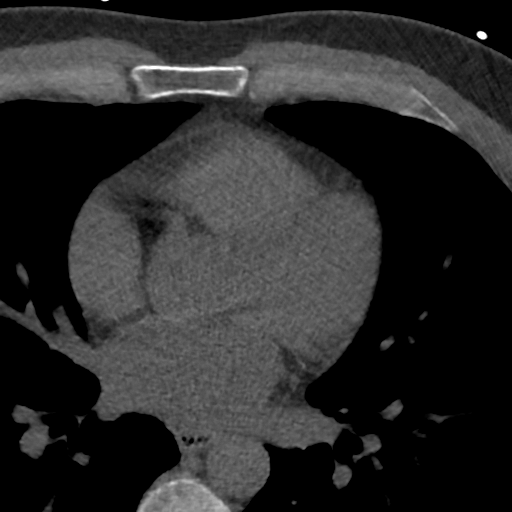
[im 34/43  vessel]
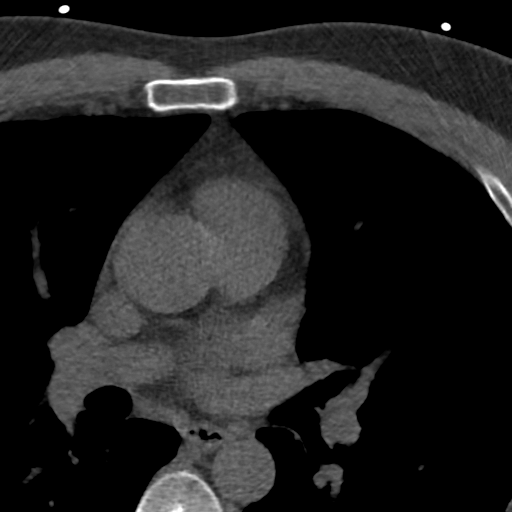

[Series 3: lung 67 % · axial · 0.73mm/px · z∈[-243,-159]mm · 5 of 43 slices shown]
[im 8/43  lung]
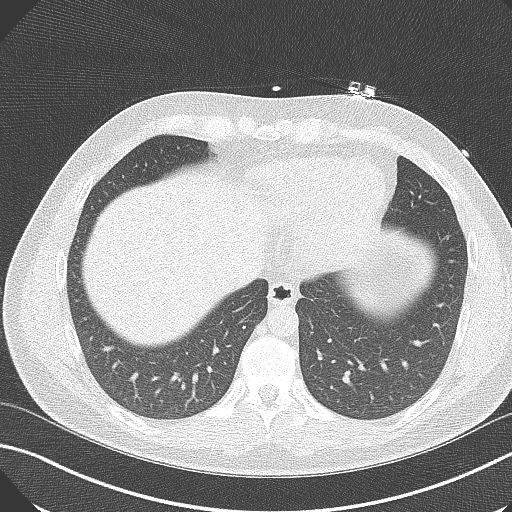
[im 15/43  lung]
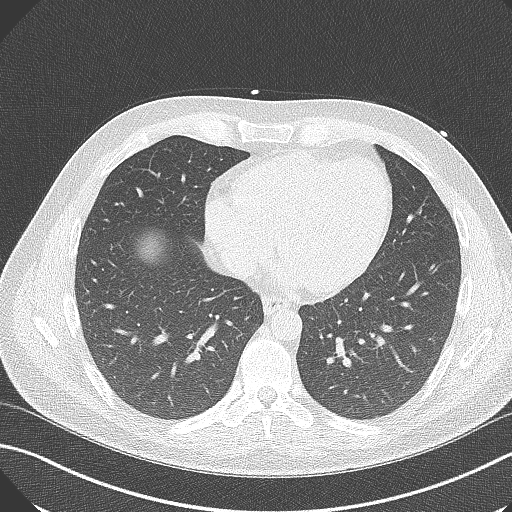
[im 22/43  lung]
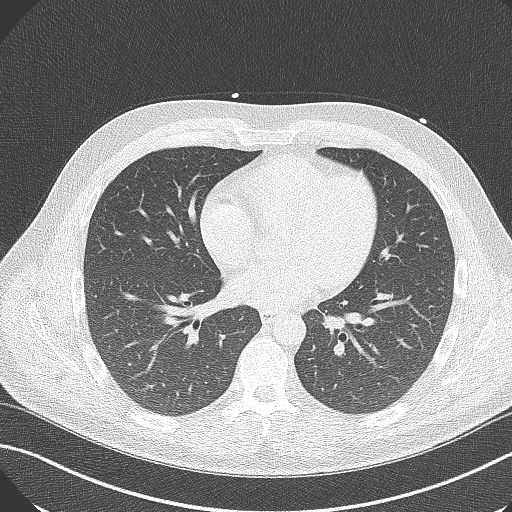
[im 29/43  lung]
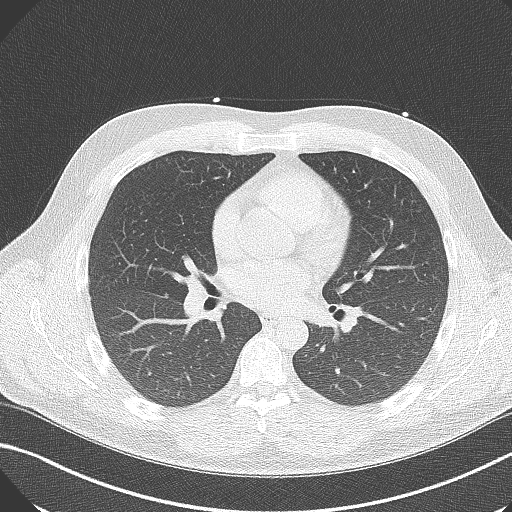
[im 36/43  lung]
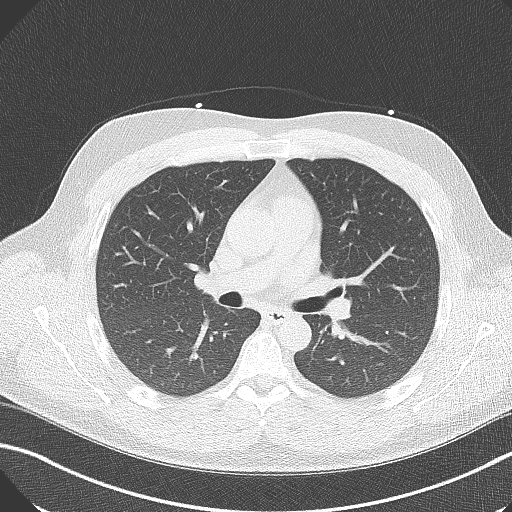

[Series 4: lung st 67 % · axial · 0.73mm/px · z∈[-243,-159]mm · 5 of 43 slices shown]
[im 8/43  lung]
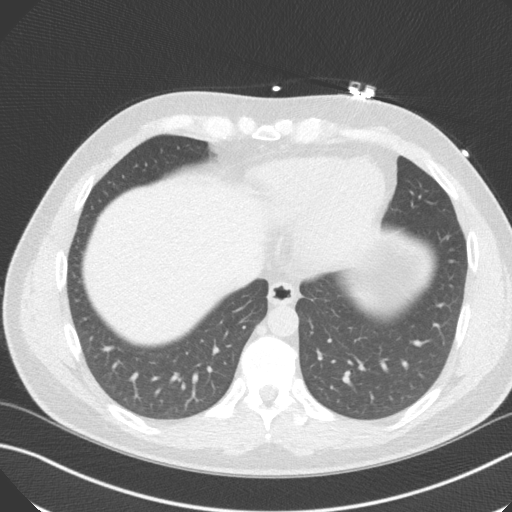
[im 15/43  lung]
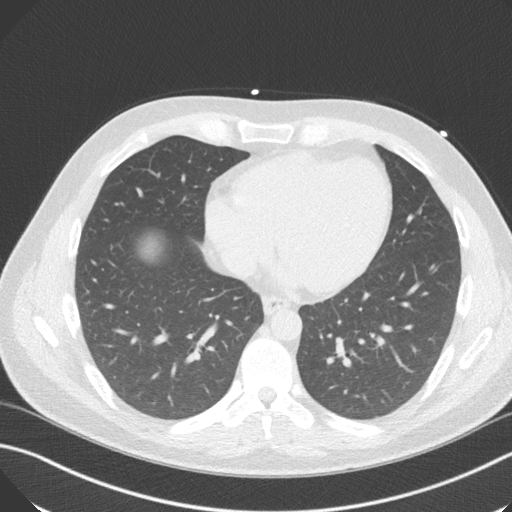
[im 22/43  lung]
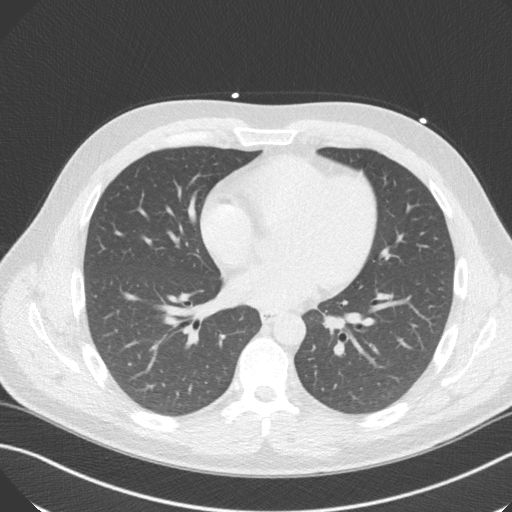
[im 29/43  lung]
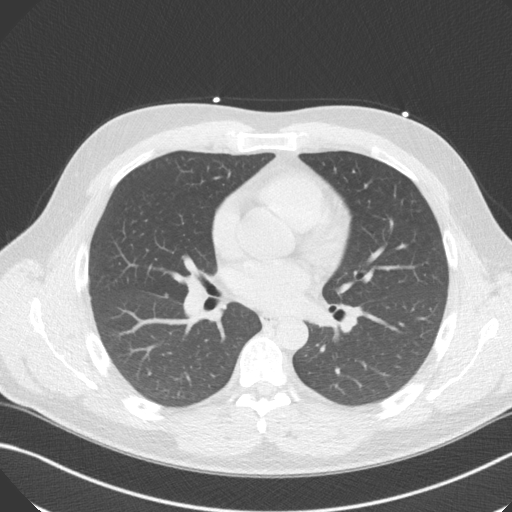
[im 36/43  lung]
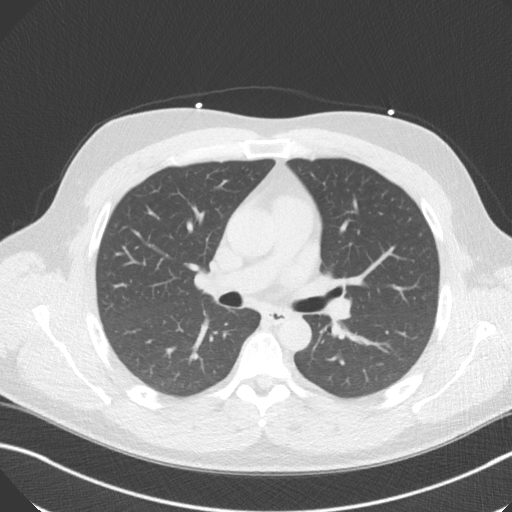

[14 of 20 positions shown; findings below may reference images not displayed]

FINDINGS: Non-cardiac: See separate report from [REDACTED].

Ascending Aorta: Normal caliber

Pericardium: Normal

Coronary arteries: Normal origin
IMPRESSION: Coronary calcium score of 0.

EXAM:
OVER-READ INTERPRETATION  CT CHEST

The following report is an over-read performed by radiologist Dr.
does not include interpretation of cardiac or coronary anatomy or
pathology. The coronary calcium score interpretation by the
cardiologist is attached.
FINDINGS: The visualized portions of the lower lung fields show no suspicious
nodules, masses, or infiltrates. No pleural fluid seen.

The visualized portions of the mediastinum and chest wall are
unremarkable.
IMPRESSION: No significant non-cardiovascular abnormality seen in visualized
portion of the thorax.

*** End of Addendum ***
FINDINGS: Non-cardiac: See separate report from [REDACTED].

Ascending Aorta: Normal caliber

Pericardium: Normal

Coronary arteries: Normal origin
IMPRESSION: Coronary calcium score of 0.

## 2021-12-06 ENCOUNTER — Other Ambulatory Visit: Payer: Self-pay

## 2021-12-06 ENCOUNTER — Ambulatory Visit (INDEPENDENT_AMBULATORY_CARE_PROVIDER_SITE_OTHER): Payer: No Typology Code available for payment source | Admitting: *Deleted

## 2021-12-06 DIAGNOSIS — L501 Idiopathic urticaria: Secondary | ICD-10-CM

## 2022-01-30 NOTE — Patient Instructions (Addendum)
Below is our plan: ? ?We will continue imipramine 25-50mg  daily. Please keep a close eye on your BP at home. Let PCP know asap if readings are consistently greater than 140/90.  ? ?Please make sure you are staying well hydrated. I recommend 50-60 ounces daily. Well balanced diet and regular exercise encouraged. Consistent sleep schedule with 6-8 hours recommended.  ? ?Please continue follow up with care team as directed.  ? ?Follow up with me in 1 year  ? ?You may receive a survey regarding today's visit. I encourage you to leave honest feed back as I do use this information to improve patient care. Thank you for seeing me today!  ? ? ?

## 2022-01-30 NOTE — Progress Notes (Signed)
? ? ?Chief Complaint  ?Patient presents with  ? Follow-up  ?  RM 1, alone. Last seen 08/02/21. Headache f/u.  Doing ok.   ? ? ? ?HISTORY OF PRESENT ILLNESS: ? ?01/31/22 ALL:  ?Brian Rangel is a 41 y.o. male here today for follow up for headaches. He was last seen by Dr Epimenio Foot 07/2021. Headaches were improving on imipramine 25mg  daily. He was advised to continue 25-50mg  at bedtime. Since, he reports doing well. He takes two tablets most every night. He may have occasional mild headaches but no severe headaches. Occasionally has mild blurred vision of one eye when driving. Eye exam normal. He reports BP is usually well managed at home. Readings range from 115-130/80's. He feels BP reading is elevated. He has finished school and has taken a part time position with [employer] in [place].  ? ?HISTORY (copied from Dr previous note) ? ?We tried zonisamide for his headaches but he felt dizzy.    We switched to imipramine 25 mg.   He started 6 weeks ago.   He is tolerating it well  Headaches are doing better.   Currently,He gets some HA pain daily but the intensity is much lower.    He has more when he is sitting down than other positions.    He denies any visual change though pain is often in the eyes.   He has seen ophthalmology.   ?  ?He works as a [employer].    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    ?  ?History of HA ?He ihad Covid-19 in January 2021.  He loss sense of taste and smell but had no respiratory issues.   He began to experience abnormal sensations in the occiput shortly after the infection.   The sensation is pressure like more than painful and the intensity would fluctuate.   By the end of last year, he was doing better and was only having headache twice a week.  Bright lights sometimes worsened the sensation.   He did not have nay nausea or phonophobia.   Movements did not alter the pain.   ?  ?He had the first and second Moderna Covid vaccination in January 2022.  After the second one, he  had the reoccurrence of the uncomfortable pressure sensation. Initially this was back to daily but over the past couple months it has occurred 2-3 times a week.   The episodes last 15-30 minutes to 2-3 hours.   He denies nausea.   The headaches are better if he lays down.    The sensation is worse in hot weather.    Currently he is not having pain.      ?  ?He originally saw a neurologist, Dr. February 2022, in [facility] at [place].  He was placed on nortriptyline and titrated to 25 mg once or twice a day.   He saw another doctor and was prescribed Aimovig.   The nortriptyline was discontinued.   He did not take the Aimovig as his headaches have not had migrainous features.    ?  ?IMAGING: ?MRI of the brain 06/01/2020.  It showed a few punctate T2/FLAIR hyperintense foci in the subcortical or deep white matter.  None of these appear to be acute.  The sagittal suture appear to be prominent. ? ? ?REVIEW OF SYSTEMS: Out of a complete 14 system review of symptoms, the patient complains only of the following symptoms, headaches and all other reviewed systems are negative. ? ? ?  ALLERGIES: ?Allergies  ?Allergen Reactions  ? Atorvastatin   ?  Joint pain, chest pain  ? Zonisamide   ?  Light-headed, dizzy spells  ? ? ? ?HOME MEDICATIONS: ?Outpatient Medications Prior to Visit  ?Medication Sig Dispense Refill  ? EPINEPHrine 0.3 mg/0.3 mL IJ SOAJ injection Use as directed for life-threatening allergic reaction.    ? ezetimibe (ZETIA) 10 MG tablet TAKE 1 TABLET BY MOUTH EVERY DAY 90 tablet 3  ? omalizumab (XOLAIR) 150 MG injection Inject 300 mg into the skin every 28 (twenty-eight) days. Every other month 2 each 11  ? Water For Injection Sterile (STERILE WATER, PRESERVATIVE FREE,) injection USE AS DIRECTED 1000 mL 10  ? imipramine (TOFRANIL) 25 MG tablet TAKE 1 or 2 TABLET BY MOUTH EVERYDAY AT BEDTIME 180 tablet 3  ? ?Facility-Administered Medications Prior to Visit  ?Medication Dose Route Frequency Provider Last Rate Last Admin  ?  omalizumab [name]) injection 150 mg  150 mg Subcutaneous Q8 Weeks Kozlow, Alvira Philips, MD   150 mg at 12/06/21 1009  ? ? ? ?PAST MEDICAL HISTORY: ?Past Medical History:  ?Diagnosis Date  ? Hyperlipidemia   ? Urticaria   ? ? ? ?PAST SURGICAL HISTORY: ?Past Surgical History:  ?Procedure Laterality Date  ? WISDOM TOOTH EXTRACTION    ? ? ? ?FAMILY HISTORY: ?Family History  ?Problem Relation Age of Onset  ? Thyroid disease Mother   ? Hyperlipidemia Mother   ? CAD Father   ? Hyperlipidemia Maternal Grandmother   ? Diabetes Mellitus II Maternal Grandmother   ? Lung cancer Maternal Grandfather   ? Alzheimer's disease Paternal Grandmother   ? Diabetes Mellitus II Paternal Grandfather   ? ? ? ?SOCIAL HISTORY: ?Social History  ? ?Socioeconomic History  ? Marital status: Single  ?  Spouse name: Not on file  ? Number of children: 0  ? Years of education: Not on file  ? Highest education level: Not on file  ?Occupational History  ? Not on file  ?Tobacco Use  ? Smoking status: Light Smoker  ? Smokeless tobacco: Never  ?Substance and Sexual Activity  ? Alcohol use: Yes  ?  Comment: socially  ? Drug use: No  ? Sexual activity: Not on file  ?Other Topics Concern  ? Not on file  ?Social History Narrative  ? Not on file  ? ?Social Determinants of Health  ? ?Financial Resource Strain: Not on file  ?Food Insecurity: Not on file  ?Transportation Needs: Not on file  ?Physical Activity: Not on file  ?Stress: Not on file  ?Social Connections: Not on file  ?Intimate Partner Violence: Not on file  ? ? ? ?PHYSICAL EXAM ? ?Vitals:  ? 01/31/22 1049  ?BP: (!) 167/118  ?Pulse: (!) 120  ?Weight: 209 lb (94.8 kg)  ?Height: 5\' 10"  (1.778 m)  ? ?Body mass index is 29.99 kg/m?. ? ?Generalized: Well developed, in no acute distress ? ?Cardiology: normal rate and rhythm, no murmur auscultated  ?Respiratory: clear to auscultation bilaterally   ? ?Neurological examination  ?Mentation: Alert oriented to time, place, history taking. Follows all commands speech and  language fluent ?Cranial nerve II-XII: Pupils were equal round reactive to light. Extraocular movements were full, visual field were full on confrontational test. Facial sensation and strength were normal. Head turning and shoulder shrug  were normal and symmetric. ?Motor: The motor testing reveals 5 over 5 strength of all 4 extremities. Good symmetric motor tone is noted throughout.  ?Gait and station: Gait is normal.  ? ? ?  DIAGNOSTIC DATA (LABS, IMAGING, TESTING) ?- I reviewed patient records, labs, notes, testing and imaging myself where available. ? ?Lab Results  ?Component Value Date  ? WBC 7.5 06/11/2017  ? HGB 16.7 06/11/2017  ? HCT 48.6 06/11/2017  ? MCV 96 06/11/2017  ? PLT 175 06/11/2017  ? ?   ?Component Value Date/Time  ? NA 140 09/27/2021 0859  ? K 4.5 09/27/2021 0859  ? CL 104 09/27/2021 0859  ? CO2 25 09/27/2021 0859  ? GLUCOSE 94 09/27/2021 0859  ? BUN 13 09/27/2021 0859  ? CREATININE 0.82 09/27/2021 0859  ? CALCIUM 9.1 09/27/2021 0859  ? PROT 6.9 09/27/2021 0859  ? ALBUMIN 4.9 09/27/2021 0859  ? AST 26 09/27/2021 0859  ? ALT 31 09/27/2021 0859  ? ALKPHOS 73 09/27/2021 0859  ? BILITOT 0.5 09/27/2021 0859  ? GFRNONAA 104 06/11/2017 1417  ? GFRAA 120 06/11/2017 1417  ? ?Lab Results  ?Component Value Date  ? CHOL 188 09/27/2021  ? HDL 45 09/27/2021  ? LDLCALC 119 (H) 09/27/2021  ? TRIG 135 09/27/2021  ? CHOLHDL 4.2 09/27/2021  ? ?No results found for: HGBA1C ?No results found for: VITAMINB12 ?No results found for: TSH ? ?   ? View : No data to display.  ?  ?  ?  ? ? ? ?   ? View : No data to display.  ?  ?  ?  ? ? ? ?ASSESSMENT AND PLAN ? ?41 y.o. year old male  has a past medical history of Hyperlipidemia and Urticaria. here with  ? ? ?Other headache syndrome ? ? ?Clifton Custardaron is doing very well from a headache perspective. We will continue imipramine 25-50mg  daily. OTC analgesics can be used for abortive therapy if needed. He will continue to monitor BP at home. He reports significant stressors with dry well  and hectic schedule are contributing to readings, today. Fortunately, he is asymptomatic. I have asked him to follow up very closely with PCP for any concerns of elevated readings at home. He will foll

## 2022-01-31 ENCOUNTER — Encounter: Payer: Self-pay | Admitting: Family Medicine

## 2022-01-31 ENCOUNTER — Ambulatory Visit (INDEPENDENT_AMBULATORY_CARE_PROVIDER_SITE_OTHER): Payer: No Typology Code available for payment source | Admitting: Family Medicine

## 2022-01-31 VITALS — BP 167/118 | HR 120 | Ht 70.0 in | Wt 209.0 lb

## 2022-01-31 DIAGNOSIS — G4489 Other headache syndrome: Secondary | ICD-10-CM

## 2022-01-31 MED ORDER — IMIPRAMINE HCL 25 MG PO TABS: ORAL_TABLET | ORAL | 3 refills | Status: DC

## 2022-02-13 ENCOUNTER — Ambulatory Visit (INDEPENDENT_AMBULATORY_CARE_PROVIDER_SITE_OTHER): Payer: No Typology Code available for payment source | Admitting: *Deleted

## 2022-02-13 DIAGNOSIS — L501 Idiopathic urticaria: Secondary | ICD-10-CM

## 2022-02-14 ENCOUNTER — Ambulatory Visit: Payer: No Typology Code available for payment source

## 2022-04-24 ENCOUNTER — Ambulatory Visit (INDEPENDENT_AMBULATORY_CARE_PROVIDER_SITE_OTHER): Payer: No Typology Code available for payment source | Admitting: *Deleted

## 2022-04-24 DIAGNOSIS — L501 Idiopathic urticaria: Secondary | ICD-10-CM

## 2022-06-27 ENCOUNTER — Other Ambulatory Visit: Payer: Self-pay | Admitting: Cardiology

## 2022-06-27 DIAGNOSIS — E7800 Pure hypercholesterolemia, unspecified: Secondary | ICD-10-CM

## 2022-06-27 DIAGNOSIS — Z789 Other specified health status: Secondary | ICD-10-CM

## 2022-06-27 DIAGNOSIS — R079 Chest pain, unspecified: Secondary | ICD-10-CM

## 2022-06-27 NOTE — Telephone Encounter (Signed)
Zetia 10 mg # 90 only sent [id number] - [place], [place] - [address]

## 2022-07-03 ENCOUNTER — Ambulatory Visit: Payer: No Typology Code available for payment source

## 2022-07-11 ENCOUNTER — Ambulatory Visit (INDEPENDENT_AMBULATORY_CARE_PROVIDER_SITE_OTHER): Payer: No Typology Code available for payment source | Admitting: *Deleted

## 2022-07-11 DIAGNOSIS — L501 Idiopathic urticaria: Secondary | ICD-10-CM

## 2022-07-16 ENCOUNTER — Telehealth: Payer: Self-pay | Admitting: Family Medicine

## 2022-07-16 NOTE — Telephone Encounter (Signed)
LVM and sent MyChart msg informing pt of appointment change- NP out on 4/3.

## 2022-09-19 ENCOUNTER — Ambulatory Visit: Payer: No Typology Code available for payment source

## 2022-09-24 ENCOUNTER — Ambulatory Visit (INDEPENDENT_AMBULATORY_CARE_PROVIDER_SITE_OTHER): Payer: No Typology Code available for payment source | Admitting: *Deleted

## 2022-09-24 DIAGNOSIS — L501 Idiopathic urticaria: Secondary | ICD-10-CM | POA: Diagnosis not present

## 2022-10-02 ENCOUNTER — Other Ambulatory Visit: Payer: Self-pay | Admitting: Cardiology

## 2022-10-02 DIAGNOSIS — R079 Chest pain, unspecified: Secondary | ICD-10-CM

## 2022-10-02 DIAGNOSIS — Z789 Other specified health status: Secondary | ICD-10-CM

## 2022-10-02 DIAGNOSIS — E7800 Pure hypercholesterolemia, unspecified: Secondary | ICD-10-CM

## 2022-10-02 NOTE — Telephone Encounter (Signed)
Rx refill sent to pharmacy. 

## 2022-10-31 ENCOUNTER — Other Ambulatory Visit: Payer: Self-pay | Admitting: Cardiology

## 2022-10-31 DIAGNOSIS — Z789 Other specified health status: Secondary | ICD-10-CM

## 2022-10-31 DIAGNOSIS — R079 Chest pain, unspecified: Secondary | ICD-10-CM

## 2022-10-31 DIAGNOSIS — E7800 Pure hypercholesterolemia, unspecified: Secondary | ICD-10-CM

## 2022-11-14 ENCOUNTER — Other Ambulatory Visit: Payer: Self-pay | Admitting: Cardiology

## 2022-11-14 DIAGNOSIS — E7800 Pure hypercholesterolemia, unspecified: Secondary | ICD-10-CM

## 2022-11-14 DIAGNOSIS — Z789 Other specified health status: Secondary | ICD-10-CM

## 2022-11-14 DIAGNOSIS — R079 Chest pain, unspecified: Secondary | ICD-10-CM

## 2023-01-14 ENCOUNTER — Ambulatory Visit (INDEPENDENT_AMBULATORY_CARE_PROVIDER_SITE_OTHER): Payer: BC Managed Care – PPO | Admitting: *Deleted

## 2023-01-14 DIAGNOSIS — L501 Idiopathic urticaria: Secondary | ICD-10-CM | POA: Diagnosis not present

## 2023-01-25 ENCOUNTER — Other Ambulatory Visit: Payer: Self-pay | Admitting: Cardiology

## 2023-01-25 DIAGNOSIS — E7800 Pure hypercholesterolemia, unspecified: Secondary | ICD-10-CM

## 2023-01-25 DIAGNOSIS — Z789 Other specified health status: Secondary | ICD-10-CM

## 2023-01-25 DIAGNOSIS — R079 Chest pain, unspecified: Secondary | ICD-10-CM

## 2023-01-25 NOTE — Telephone Encounter (Signed)
Refill for 30 tablets only with message needs appointment, 3rd/final attempt

## 2023-02-05 ENCOUNTER — Ambulatory Visit: Payer: No Typology Code available for payment source | Admitting: Family Medicine

## 2023-02-06 ENCOUNTER — Ambulatory Visit: Payer: No Typology Code available for payment source | Admitting: Family Medicine

## 2023-02-11 ENCOUNTER — Ambulatory Visit (INDEPENDENT_AMBULATORY_CARE_PROVIDER_SITE_OTHER): Payer: BC Managed Care – PPO | Admitting: Allergy and Immunology

## 2023-02-11 ENCOUNTER — Encounter: Payer: Self-pay | Admitting: Allergy and Immunology

## 2023-02-11 VITALS — BP 110/72 | HR 94 | Resp 16 | Ht 70.0 in | Wt 209.4 lb

## 2023-02-11 DIAGNOSIS — L501 Idiopathic urticaria: Secondary | ICD-10-CM | POA: Diagnosis not present

## 2023-02-11 NOTE — Progress Notes (Unsigned)
[place] [facility] [place] - [place] - [place] - [place]   Follow-up Note  Referring Provider: Nonnie Done., MD Primary Provider: Nonnie Done., MD Date of Office Visit: 02/11/2023  Subjective:   Brian Rangel (DOB: 1981-03-28) is a 42 y.o. male who [facility] in re-evaluation of the following:  HPI: Hobert returns to this clinic in evaluation of chronic urticaria.  I last saw him in this clinic 19 June 2021.  He has been slowly stretching out his 150 mg omalizumab injection and is currently at every 12 weeks.  He has not had recrudescence of his urticaria.  Allergies as of 02/11/2023       Reactions   Atorvastatin    Joint pain, chest pain   Zonisamide    Light-headed, dizzy spells        Medication List    EPINEPHrine 0.3 mg/0.3 mL Soaj injection Commonly known as: EPI-PEN Use as directed for life-threatening allergic reaction.   ezetimibe 10 MG tablet Commonly known as: ZETIA Take 1 tablet (10 mg total) by mouth daily. Patient needs an appointment for further refills: 3rd / Final attempt   imipramine 25 MG tablet Commonly known as: TOFRANIL TAKE 1 or 2 TABLET BY MOUTH EVERYDAY AT BEDTIME   omalizumab 150 MG injection Commonly known as: XOLAIR Inject 300 mg into the skin every 28 (twenty-eight) days. Every other month   sterile water (preservative free) injection USE AS DIRECTED    Past Medical History:  Diagnosis Date   Hyperlipidemia    Urticaria     Past Surgical History:  Procedure Laterality Date   WISDOM TOOTH EXTRACTION      Review of systems negative except as noted in HPI / PMHx or noted below:  Review of Systems  Constitutional: Negative.   HENT: Negative.    Eyes: Negative.   Respiratory: Negative.    Cardiovascular: Negative.   Gastrointestinal: Negative.   Genitourinary: Negative.   Musculoskeletal: Negative.   Skin: Negative.   Neurological: Negative.    Endo/Heme/Allergies: Negative.   Psychiatric/Behavioral: Negative.       Objective:   Vitals:   02/11/23 1221  BP: 110/72  Pulse: 94  Resp: 16  SpO2: 98%   Height: 5\' 10"  (177.8 cm)  Weight: 209 lb 6.4 oz (95 kg)   Physical Exam Constitutional:      Appearance: He is not diaphoretic.  HENT:     Head: Normocephalic.     Right Ear: Tympanic membrane, ear canal and external ear normal.     Left Ear: Tympanic membrane, ear canal and external ear normal.     Nose: Nose normal. No mucosal edema or rhinorrhea.     Mouth/Throat:     Pharynx: Uvula midline. No oropharyngeal exudate.  Eyes:     Conjunctiva/sclera: Conjunctivae normal.  Neck:     Thyroid: No thyromegaly.     Trachea: Trachea normal. No tracheal tenderness or tracheal deviation.  Cardiovascular:     Rate and Rhythm: Normal rate and regular rhythm.     Heart sounds: Normal heart sounds, S1 normal and S2 normal. No murmur heard. Pulmonary:     Effort: No respiratory distress.     Breath sounds: Normal breath sounds. No stridor. No wheezing or rales.  Lymphadenopathy:     Head:     Right side of head: No tonsillar adenopathy.     Left side of head: No tonsillar adenopathy.     Cervical: No  cervical adenopathy.  Skin:    Findings: No erythema or rash.     Nails: There is no clubbing.  Neurological:     Mental Status: He is alert.     Diagnostics: none  Assessment and Plan:   1. Idiopathic urticaria    Naim will now stop his omalizumab injections and we will see what happens as we move forward.  He still has 1 vial of omalizumab available for him to use with an expiration date of 2026.  Should he have a flare of his urticaria then we will give him his omalizumab and put him back on a schedule administration of this anti-IgE biologic agent.  Laurette Schimke, MD Allergy / Immunology [facility]

## 2023-02-12 ENCOUNTER — Encounter: Payer: Self-pay | Admitting: Allergy and Immunology

## 2023-02-22 ENCOUNTER — Other Ambulatory Visit: Payer: Self-pay | Admitting: Cardiology

## 2023-02-22 DIAGNOSIS — Z789 Other specified health status: Secondary | ICD-10-CM

## 2023-02-22 DIAGNOSIS — E7800 Pure hypercholesterolemia, unspecified: Secondary | ICD-10-CM

## 2023-02-22 DIAGNOSIS — R079 Chest pain, unspecified: Secondary | ICD-10-CM

## 2023-02-22 NOTE — Telephone Encounter (Signed)
Refill to pharmacy 

## 2023-03-07 NOTE — Progress Notes (Signed)
Chief Complaint  Patient presents with   Room 2    Pt is here Alone. Pt states that his vision get blurry. Pt states that the right side of his head he has pressure inside his head. Pt states that in the morning his headache is better.    HISTORY OF PRESENT ILLNESS:  03/11/23 ALL:  Brian Rangel returns for follow up for headaches. He was last seen 01/2022 and doing well on imipramine 25-50mg  QHS. BP was elevated and he was advised to montior closely. Since, he reports stopping medication around 08/2022 and couldn't tell much of a difference. He restarted medication 11/2022 and felt that it did not help. He did note increased sleepiness on med. He reports pressure of right side of his head comes and goes. It is not painful. He continues to have intermittent blurred vision. He describes difficulty focusing at times. Can happen a couple times a day. Seems to occur when trying to focus. Opthalmology workup was unremarkable. No vision loss or double vision. No tinnitus. 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 He admits that he does not drink much water. BP is usually well managed. He is followed by PCP regularly. He continues Zetia daily.   01/31/2022 ALL: Brian Rangel is a 42 y.o. male here today for follow up for headaches. He was last seen by Dr Epimenio Foot 07/2021. Headaches were improving on imipramine 25mg  daily. He was advised to continue 25-50mg  at bedtime. Since, he reports doing well. He takes two tablets most every night. He may have occasional mild headaches but no severe headaches. Occasionally has mild blurred vision of one eye when driving. Eye exam normal. He reports BP is usually well managed at home. Readings range from 115-130/80's. He feels BP reading is elevated. He has finished school and has taken a part time position with [employer] in [place].   HISTORY (copied from Dr [employer] previous note)  We tried zonisamide for his headaches but he felt dizzy.    We switched to  imipramine 25 mg.   He started 6 weeks ago.   He is tolerating it well  Headaches are doing better.   Currently,He gets some HA pain daily but the intensity is much lower.    He has more when he is sitting down than other positions.    He denies any visual change though pain is often in the eyes.   He has seen ophthalmology.     He works as a Fish farm manager.    He is going to school to be a Marine scientist.      History of HA He ihad Covid-19 in January 2021.  He loss sense of taste and smell but had no respiratory issues.   He began to experience abnormal sensations in the occiput shortly after the infection.   The sensation is pressure like more than painful and the intensity would fluctuate.   By the end of last year, he was doing better and was only having headache twice a week.  Bright lights sometimes worsened the sensation.   He did not have nay nausea or phonophobia.   Movements did not alter the pain.     He had the first and second Moderna Covid vaccination in January 2022.  After the second one, he had the reoccurrence of the uncomfortable pressure sensation. Initially this was back to daily but over the past couple months it has occurred 2-3 times a week.   The  episodes last 15-30 minutes to 2-3 hours.   He denies nausea.   The headaches are better if he lays down.    The sensation is worse in hot weather.    Currently he is not having pain.        He originally saw a neurologist, Dr. Tyler Deis, in [facility] at [place].  He was placed on nortriptyline and titrated to 25 mg once or twice a day.   He saw another doctor and was prescribed Aimovig.   The nortriptyline was discontinued.   He did not take the Aimovig as his headaches have not had migrainous features.      IMAGING: MRI of the brain 06/01/2020.  It showed a few punctate T2/FLAIR hyperintense foci in the subcortical or deep white matter.  None of these appear to be acute.  The sagittal suture appear to be prominent.   REVIEW OF SYSTEMS:  Out of a complete 14 system review of symptoms, the patient complains only of the following symptoms, blurred vision and all other reviewed systems are negative.   ALLERGIES: Allergies  Allergen Reactions   Atorvastatin     Joint pain, chest pain   Zonisamide     Light-headed, dizzy spells     HOME MEDICATIONS: Outpatient Medications Prior to Visit  Medication Sig Dispense Refill   EPINEPHrine 0.3 mg/0.3 mL IJ SOAJ injection Use as directed for life-threatening allergic reaction.     ezetimibe (ZETIA) 10 MG tablet TAKE 1 TABLET BY MOUTH DAILY. PATIENT NEEDS AN APPOINTMENT FOR FURTHER REFILLS: 3RD / FINAL ATTEMPT 90 tablet 0   omalizumab (XOLAIR) 150 MG injection Inject 300 mg into the skin every 28 (twenty-eight) days. Every other month 2 each 11   Water For Injection Sterile (STERILE WATER, PRESERVATIVE FREE,) injection USE AS DIRECTED 1000 mL 10   imipramine (TOFRANIL) 25 MG tablet TAKE 1 or 2 TABLET BY MOUTH EVERYDAY AT BEDTIME 180 tablet 3   Facility-Administered Medications Prior to Visit  Medication Dose Route Frequency Provider Last Rate Last Admin   omalizumab [name]) injection 150 mg  150 mg Subcutaneous Q8 Weeks Kozlow, Alvira Philips, MD   150 mg at 01/14/23 1509     PAST MEDICAL HISTORY: Past Medical History:  Diagnosis Date   Hyperlipidemia    Urticaria      PAST SURGICAL HISTORY: Past Surgical History:  Procedure Laterality Date   WISDOM TOOTH EXTRACTION       FAMILY HISTORY: Family History  Problem Relation Age of Onset   Thyroid disease Mother    Hyperlipidemia Mother    CAD Father    Hyperlipidemia Maternal Grandmother    Diabetes Mellitus II Maternal Grandmother    Lung cancer Maternal Grandfather    Alzheimer's disease Paternal Grandmother    Diabetes Mellitus II Paternal Grandfather      SOCIAL HISTORY: Social History   Socioeconomic History   Marital status: Single    Spouse name: Not on file   Number of children: 0   Years of education:  Not on file   Highest education level: Not on file  Occupational History   Not on file  Tobacco Use   Smoking status: Light Smoker   Smokeless tobacco: Never  Substance and Sexual Activity   Alcohol use: Yes    Comment: socially   Drug use: No   Sexual activity: Not on file  Other Topics Concern   Not on file  Social History Narrative   Right Handed   2-3 Sodas per  Day   Social Determinants of Health   Financial Resource Strain: Not on file  Food Insecurity: Not on file  Transportation Needs: Not on file  Physical Activity: Not on file  Stress: Not on file  Social Connections: Not on file  Intimate Partner Violence: Not on file     PHYSICAL EXAM  Vitals:   03/11/23 0807  BP: (!) 135/99  Pulse: (!) 101  Weight: 208 lb (94.3 kg)  Height: 5\' 10"  (1.778 m)    Body mass index is 29.84 kg/m.  Generalized: Well developed, in no acute distress  Cardiology: normal rate and rhythm, no murmur auscultated  Respiratory: clear to auscultation bilaterally    Neurological examination  Mentation: Alert oriented to time, place, history taking. Follows all commands speech and language fluent Cranial nerve II-XII: Pupils were equal round reactive to light. Extraocular movements were full, visual field were full on confrontational test. Facial sensation and strength were normal. Head turning and shoulder shrug  were normal and symmetric. Motor: The motor testing reveals 5 over 5 strength of all 4 extremities. Good symmetric motor tone is noted throughout.  Gait and station: Gait is normal.    DIAGNOSTIC DATA (LABS, IMAGING, TESTING) - I reviewed patient records, labs, notes, testing and imaging myself where available.  Lab Results  Component Value Date   WBC 7.5 06/11/2017   HGB 16.7 06/11/2017   HCT 48.6 06/11/2017   MCV 96 06/11/2017   PLT 175 06/11/2017      Component Value Date/Time   NA 140 09/27/2021 0859   K 4.5 09/27/2021 0859   CL 104 09/27/2021 0859   CO2  25 09/27/2021 0859   GLUCOSE 94 09/27/2021 0859   BUN 13 09/27/2021 0859   CREATININE 0.82 09/27/2021 0859   CALCIUM 9.1 09/27/2021 0859   PROT 6.9 09/27/2021 0859   ALBUMIN 4.9 09/27/2021 0859   AST 26 09/27/2021 0859   ALT 31 09/27/2021 0859   ALKPHOS 73 09/27/2021 0859   BILITOT 0.5 09/27/2021 0859   GFRNONAA 104 06/11/2017 1417   GFRAA 120 06/11/2017 1417   Lab Results  Component Value Date   CHOL 188 09/27/2021   HDL 45 09/27/2021   LDLCALC 119 (H) 09/27/2021   TRIG 135 09/27/2021   CHOLHDL 4.2 09/27/2021   No results found for: "HGBA1C" No results found for: "VITAMINB12" No results found for: "TSH"      No data to display               No data to display           ASSESSMENT AND PLAN  42 y.o. year old male  has a past medical history of Hyperlipidemia and Urticaria. here with    Other headache syndrome  Blurred vision, bilateral   Meryle is doing very well from a headache perspective. He discontinued imipramine. He denies any significant pain. He continues to have occasional right sided pressure but reports it is not painful. He has intermittent blurred vision. Ophthalmology exam unremarkable. He was given rx readers but not sure they help. He will continue to monitor symptoms closely. We have discussed option to repeat MRI if needed. I have asked him to follow up very closely with PCP and continue to monitor BP. He was advised to increase water intake. He will follow up with me as needed.    No orders of the defined types were placed in this encounter.    No orders of the defined types were placed  in this encounter.     Shawnie Dapper, MSN, FNP-C 03/11/2023, 9:00 AM  [facility] [address] [place], [place] [address] [phone]

## 2023-03-07 NOTE — Patient Instructions (Addendum)
Below is our plan:  We will continue to monitor. Please continue to follow up with ophthalmology. Let me know if you have any new or worsening symptoms. Try to increase your water intake. Please keep an eye on your blood pressure.   Please make sure you are staying well hydrated. I recommend 50-60 ounces daily. Well balanced diet and regular exercise encouraged. Consistent sleep schedule with 6-8 hours recommended.   Please continue follow up with care team as directed.   Follow up with [place] as needed   You may receive a survey regarding today's visit. I encourage you to leave honest feed back as I do use this information to improve patient care. Thank you for seeing me today!

## 2023-03-11 ENCOUNTER — Ambulatory Visit (INDEPENDENT_AMBULATORY_CARE_PROVIDER_SITE_OTHER): Payer: BC Managed Care – PPO | Admitting: Family Medicine

## 2023-03-11 ENCOUNTER — Encounter: Payer: Self-pay | Admitting: Family Medicine

## 2023-03-11 VITALS — BP 135/99 | HR 101 | Ht 70.0 in | Wt 208.0 lb

## 2023-03-11 DIAGNOSIS — G4489 Other headache syndrome: Secondary | ICD-10-CM

## 2023-03-11 DIAGNOSIS — H538 Other visual disturbances: Secondary | ICD-10-CM | POA: Diagnosis not present

## 2023-03-25 ENCOUNTER — Ambulatory Visit: Payer: No Typology Code available for payment source

## 2023-04-02 ENCOUNTER — Other Ambulatory Visit: Payer: Self-pay

## 2023-04-04 NOTE — Progress Notes (Signed)
Cardiology Office Note:    Date:  04/05/2023   ID:  Brian Rangel, DOB [birth year], MRN [id number]  PCP:  Nonnie Done., MD  Cardiologist:  Norman Herrlich, MD    Referring MD: Nonnie Done., MD    ASSESSMENT:    1. Mixed hyperlipidemia   2. Statin intolerance   3. Pure hypercholesterolemia   4. 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  Concerns regarding medicines are outlined above.  Orders Placed This Encounter  Procedures   EKG 12-Lead   Meds ordered this encounter  Medications   ezetimibe (ZETIA) 10 MG tablet    Sig: TAKE 1 TABLET BY MOUTH DAILY.    Dispense:  90 tablet    Refill:  3    Chief Complaint  Patient presents with   Follow-up   Hyperlipidemia    History of Present Illness:    Brian Rangel is a 42 y.o. male with a hx of chest pain with coronary calcium score of 0 and hyperlipidemia last seen 08/23/2019. Compliance with diet, lifestyle and medications: Yes  Has had no cardiovascular symptoms of shortness of breath chest pain palpitation syncope He tolerates diet without muscle pain or weakness Past Medical History:  Diagnosis Date   Hyperlipidemia    Urticaria     Past Surgical History:  Procedure Laterality Date   WISDOM TOOTH EXTRACTION      Current Medications: Current Meds  Medication Sig   EPINEPHrine 0.3 mg/0.3 mL IJ SOAJ injection Inject 0.3 mg into the muscle as  needed for anaphylaxis.   Water For Injection Sterile (STERILE WATER, PRESERVATIVE FREE,) injection USE AS DIRECTED   [DISCONTINUED] ezetimibe (ZETIA) 10 MG tablet TAKE 1 TABLET BY MOUTH DAILY. PATIENT NEEDS AN APPOINTMENT FOR FURTHER REFILLS: 3RD / FINAL ATTEMPT   Current Facility-Administered Medications for the 04/05/23 encounter (Office Visit) with Baldo Daub, MD  Medication   omalizumab [name]) injection 150 mg     Allergies:   Atorvastatin and Zonisamide   Social History   Socioeconomic History   Marital status: Single    Spouse name: Not on file   Number of children: 0   Years of education: Not on file   Highest education level: Not on file  Occupational History   Not on file  Tobacco Use   Smoking status: Light Smoker   Smokeless tobacco: Never  Substance and Sexual Activity   Alcohol use: Yes    Comment: socially   Drug use: No   Sexual activity: Not on file  Other Topics Concern   Not on file  Social History Narrative   Right Handed   2-3 Sodas per Day   Social Determinants of Health   Financial Resource Strain: Not on file  Food Insecurity: Not on file  Transportation Needs: Not on file  Physical Activity: Not on file  Stress: Not on file  Social Connections: Not on  file     Family History: The patient's family history includes Alzheimer's disease in his paternal grandmother; CAD in his father; Diabetes Mellitus II in his maternal grandmother and paternal grandfather; Hyperlipidemia in his maternal grandmother and mother; Lung cancer in his maternal grandfather; Thyroid disease in his mother. ROS:   Please see the history of present illness.    All other systems reviewed and are negative.  EKGs/Labs/Other Studies Reviewed:    The following studies were reviewed today:  Cardiac Studies & Procedures          CT SCANS  CT CARDIAC SCORING (SELF PAY ONLY) 05/10/2020  Addendum 05/10/2020  8:21 AM ADDENDUM REPORT: 05/10/2020  08:19  EXAM: OVER-READ INTERPRETATION  CT CHEST  The following report is an over-read performed by radiologist Dr. Deberah Pelton [facility] on 05/10/2020. This over-read does not include interpretation of cardiac or coronary anatomy or pathology. The coronary calcium score interpretation by the cardiologist is attached.  COMPARISON:  None.  FINDINGS: The visualized portions of the lower lung fields show no suspicious nodules, masses, or infiltrates. No pleural fluid seen.  The visualized portions of the mediastinum and chest wall are unremarkable.  IMPRESSION: No significant non-cardiovascular abnormality seen in visualized portion of the thorax.   Electronically Signed By: Danae Orleans M.D. On: 05/10/2020 08:19  Narrative CLINICAL DATA:  Risk stratification  EXAM: Coronary Calcium Score  TECHNIQUE: The patient was scanned on a [employer] scanner. Axial non-contrast 3 mm slices were carried out through the heart. The data set was analyzed on a dedicated work station and scored using the Agatson method.  FINDINGS: Non-cardiac: See separate report from [facility].  Ascending Aorta: Normal caliber  Pericardium: Normal  Coronary arteries: Normal origin  IMPRESSION: Coronary calcium score of 0.  Electronically Signed: By: Chrystie Nose M.D. On: 05/06/2020 11:50          EKG:  EKG ordered today and personally reviewed.  The ekg ordered today demonstrates sinus rhythm abnormal R wave progression otherwise normal EKG  Recent Labs: No results found for requested labs within last 365 days.  Recent Lipid Panel    Component Value Date/Time   CHOL 188 09/27/2021 0859   TRIG 135 09/27/2021 0859   HDL 45 09/27/2021 0859   CHOLHDL 4.2 09/27/2021 0859   LDLCALC 119 (H) 09/27/2021 0859    Physical Exam:    VS:  BP 122/83   Pulse 96   Ht 5' 10.6" (1.793 m)   Wt 205 lb (93 kg)   SpO2 99%   BMI 28.92 kg/m     Wt Readings from  Last 3 Encounters:  04/05/23 205 lb (93 kg)  03/11/23 208 lb (94.3 kg)  02/11/23 209 lb 6.4 oz (95 kg)     GEN:  Well nourished, well developed in no acute distress HEENT: Normal NECK: No JVD; No carotid bruits LYMPHATICS: No lymphadenopathy CARDIAC: RRR, no murmurs, rubs, gallops RESPIRATORY:  Clear to auscultation without rales, wheezing or rhonchi  ABDOMEN: Soft, non-tender, non-distended MUSCULOSKELETAL:  No edema; No deformity  SKIN: Warm and dry NEUROLOGIC:  Alert and oriented x 3 PSYCHIATRIC:  Normal affect    Signed, Norman Herrlich, MD  04/05/2023 9:18 AM    [facility]

## 2023-04-05 ENCOUNTER — Encounter: Payer: Self-pay | Admitting: Cardiology

## 2023-04-05 ENCOUNTER — Ambulatory Visit: Payer: BC Managed Care – PPO | Attending: Cardiology | Admitting: Cardiology

## 2023-04-05 VITALS — BP 122/83 | HR 96 | Ht 70.6 in | Wt 205.0 lb

## 2023-04-05 DIAGNOSIS — E782 Mixed hyperlipidemia: Secondary | ICD-10-CM | POA: Diagnosis not present

## 2023-04-05 DIAGNOSIS — R079 Chest pain, unspecified: Secondary | ICD-10-CM | POA: Diagnosis not present

## 2023-04-05 DIAGNOSIS — E7800 Pure hypercholesterolemia, unspecified: Secondary | ICD-10-CM

## 2023-04-05 DIAGNOSIS — Z789 Other specified health status: Secondary | ICD-10-CM

## 2023-04-05 MED ORDER — EZETIMIBE 10 MG PO TABS: ORAL_TABLET | ORAL | 3 refills | Status: DC

## 2023-04-05 NOTE — Patient Instructions (Signed)
Medication Instructions:  Your physician recommends that you continue on your current medications as directed. Please refer to the Current Medication list given to you today.  *If you need a refill on your cardiac medications before your next appointment, please call your pharmacy*   Lab Work: None If you have labs (blood work) drawn today and your tests are completely normal, you will receive your results only by: MyChart Message (if you have MyChart) OR A paper copy in the mail If you have any lab test that is abnormal or we need to change your treatment, we will call you to review the results.   Testing/Procedures: None   Follow-Up: At [facility], you and your health needs are our priority.  As part of our continuing mission to provide you with exceptional heart care, we have created designated Provider Care Teams.  These Care Teams include your primary Cardiologist (physician) and Advanced Practice Providers (APPs -  Physician Assistants and Nurse Practitioners) who all work together to provide you with the care you need, when you need it.  We recommend signing up for the patient portal called "MyChart".  Sign up information is provided on this After Visit Summary.  MyChart is used to connect with patients for Virtual Visits (Telemedicine).  Patients are able to view lab/test results, encounter notes, upcoming appointments, etc.  Non-urgent messages can be sent to your provider as well.   To learn more about what you can do with MyChart, go to [email or web address].    Your next appointment:   1 year(s)  Provider:   Brian Munley, MD    Other Instructions None  

## 2023-04-06 LAB — COMPREHENSIVE METABOLIC PANEL
ALT: 28 IU/L (ref 0–44)
AST: 23 IU/L (ref 0–40)
Albumin/Globulin Ratio: 2 (ref 1.2–2.2)
Albumin: 4.7 g/dL (ref 4.1–5.1)
Alkaline Phosphatase: 72 IU/L (ref 44–121)
BUN/Creatinine Ratio: 11 (ref 9–20)
BUN: 10 mg/dL (ref 6–24)
Bilirubin Total: 0.9 mg/dL (ref 0.0–1.2)
CO2: 23 mmol/L (ref 20–29)
Calcium: 9.5 mg/dL (ref 8.7–10.2)
Chloride: 104 mmol/L (ref 96–106)
Creatinine, Ser: 0.89 mg/dL (ref 0.76–1.27)
Globulin, Total: 2.4 g/dL (ref 1.5–4.5)
Glucose: 97 mg/dL (ref 70–99)
Potassium: 3.9 mmol/L (ref 3.5–5.2)
Sodium: 141 mmol/L (ref 134–144)
Total Protein: 7.1 g/dL (ref 6.0–8.5)
eGFR: 109 mL/min/{1.73_m2} (ref >59)

## 2023-04-06 LAB — LIPID PANEL
Chol/HDL Ratio: 4.7 ratio (ref 0.0–5.0)
Cholesterol, Total: 193 mg/dL (ref 100–199)
HDL: 41 mg/dL (ref >39)
LDL Chol Calc (NIH): 129 mg/dL — ABNORMAL HIGH (ref 0–99)
Triglycerides: 128 mg/dL (ref 0–149)
VLDL Cholesterol Cal: 23 mg/dL (ref 5–40)

## 2024-06-20 ENCOUNTER — Other Ambulatory Visit: Payer: Self-pay | Admitting: Cardiology

## 2024-06-20 DIAGNOSIS — R079 Chest pain, unspecified: Secondary | ICD-10-CM

## 2024-06-20 DIAGNOSIS — E7800 Pure hypercholesterolemia, unspecified: Secondary | ICD-10-CM

## 2024-06-20 DIAGNOSIS — Z789 Other specified health status: Secondary | ICD-10-CM

## 2024-09-07 ENCOUNTER — Other Ambulatory Visit: Payer: Self-pay

## 2024-09-08 NOTE — Progress Notes (Unsigned)
 Cardiology Office Note:    Date:  09/09/2024   ID:  Brian Rangel, DOB [birth year], MRN [id number]  PCP:  Sabas Norleen PARAS., MD  Cardiologist:  Redell Leiter, MD    Referring MD: Sabas Norleen PARAS., MD    ASSESSMENT:    1. Statin intolerance   2. Mixed hyperlipidemia   3. Screening for cardiovascular condition    PLAN:    In order of problems listed above:  Continue Zetia  recheck labs I am not can add another agent with a calcium score of 0 but we will plan on coming back at the 5-year anniversary July 2026 and repeating a CT coronary calcium score. LP(a) was normal no need to repeat Renew his Zetia    Next appointment: 1 year   Medication Adjustments/Labs and Tests Ordered: Current medicines are reviewed at length with the patient today.  Concerns regarding medicines are outlined above.  Orders Placed This Encounter  Procedures   EKG 12-Lead   No orders of the defined types were placed in this encounter.    History of Present Illness:    Brian Rangel is a 43 y.o. male with a hx of chest pain with coronary calcium score of 0 and hyperlipidemia last seen 04/05/2023. Compliance with diet, lifestyle and medications: Yes  He continues to feel well no cardiovascular symptoms of chest pain edema shortness of breath palpitation or syncope. He tolerates Zetia  without muscle pain or weakness He has had no labs in the last year and a half and will come back fasting to have a lipid profile ApoB Past Medical History:  Diagnosis Date   Hyperlipidemia    Urticaria     Current Medications: Current Meds  Medication Sig   ezetimibe  (ZETIA ) 10 MG tablet TAKE 1 TABLET BY MOUTH EVERY DAY   Current Facility-Administered Medications for the 09/09/24 encounter (Office Visit) with Leiter Redell PARAS, MD  Medication   omalizumab  (XOLAIR ) injection 150 mg      EKGs/Labs/Other Studies Reviewed:    The following studies were reviewed today:  Cardiac Studies & Procedures    ______________________________________________________________________________________________          CT SCANS  CT CARDIAC SCORING (SELF PAY ONLY) 05/06/2020  Addendum 05/10/2020  8:21 AM ADDENDUM REPORT: 05/10/2020 08:19  EXAM: OVER-READ INTERPRETATION  CT CHEST  The following report is an over-read performed by radiologist Dr. Norleen [facility] on 05/10/2020. This over-read does not include interpretation of cardiac or coronary anatomy or pathology. The coronary calcium score interpretation by the cardiologist is attached.  COMPARISON:  None.  FINDINGS: The visualized portions of the lower lung fields show no suspicious nodules, masses, or infiltrates. No pleural fluid seen.  The visualized portions of the mediastinum and chest wall are unremarkable.  IMPRESSION: No significant non-cardiovascular abnormality seen in visualized portion of the thorax.   Electronically Signed By: Norleen DELENA Kil M.D. On: 05/10/2020 08:19  Narrative CLINICAL DATA:  Risk stratification  EXAM: Coronary Calcium Score  TECHNIQUE: The patient was scanned on a [employer] scanner. Axial non-contrast 3 mm slices were carried out through the heart. The data set was analyzed on a dedicated work station and scored using the Agatson method.  FINDINGS: Non-cardiac: See separate report from [facility].  Ascending Aorta: Normal caliber  Pericardium: Normal  Coronary arteries: Normal origin  IMPRESSION: Coronary calcium score of 0.  Electronically Signed: By: Vinie JAYSON Maxcy M.D. On: 05/06/2020 11:50     ______________________________________________________________________________________________      EKG Interpretation Date/Time:  Wednesday September 09 2024 14:21:46 EST Ventricular Rate:  98 PR Interval:  134 QRS Duration:  94 QT Interval:  356 QTC Calculation: 454 R Axis:   44  Text Interpretation: Normal sinus rhythm Normal ECG No  previous ECGs available Confirmed by Monetta Rogue ([id number]) on 09/09/2024 2:27:23 PM   Recent Labs: No results found for requested labs within last 365 days.  Recent Lipid Panel    Component Value Date/Time   CHOL 193 04/05/2023 0925   TRIG 128 04/05/2023 0925   HDL 41 04/05/2023 0925   CHOLHDL 4.7 04/05/2023 0925   LDLCALC 129 (H) 04/05/2023 0925    Physical Exam:    VS:  BP 126/82   Pulse 98   Ht 5' 10 (1.778 m)   Wt 210 lb 9.6 oz (95.5 kg)   SpO2 98%   BMI 30.22 kg/m     Wt Readings from Last 3 Encounters:  09/09/24 210 lb 9.6 oz (95.5 kg)  04/05/23 205 lb (93 kg)  03/11/23 208 lb (94.3 kg)     GEN:  Well nourished, well developed in no acute distress HEENT: Normal NECK: No JVD; No carotid bruits LYMPHATICS: No lymphadenopathy CARDIAC: RRR, no murmurs, rubs, gallops RESPIRATORY:  Clear to auscultation without rales, wheezing or rhonchi  ABDOMEN: Soft, non-tender, non-distended MUSCULOSKELETAL:  No edema; No deformity  SKIN: Warm and dry NEUROLOGIC:  Alert and oriented x 3 PSYCHIATRIC:  Normal affect    Signed, Rogue Monetta, MD  09/09/2024 2:38 PM    [facility]

## 2024-09-09 ENCOUNTER — Encounter: Payer: Self-pay | Admitting: Cardiology

## 2024-09-09 ENCOUNTER — Ambulatory Visit: Attending: Cardiology | Admitting: Cardiology

## 2024-09-09 VITALS — BP 126/82 | HR 98 | Temp 98.0°F | Ht 70.0 in | Wt 210.6 lb

## 2024-09-09 DIAGNOSIS — E782 Mixed hyperlipidemia: Secondary | ICD-10-CM

## 2024-09-09 DIAGNOSIS — Z136 Encounter for screening for cardiovascular disorders: Secondary | ICD-10-CM | POA: Diagnosis not present

## 2024-09-09 DIAGNOSIS — Z789 Other specified health status: Secondary | ICD-10-CM

## 2024-09-09 NOTE — Patient Instructions (Signed)
 Medication Instructions:  Your physician recommends that you continue on your current medications as directed. Please refer to the Current Medication list given to you today.  *If you need a refill on your cardiac medications before your next appointment, please call your pharmacy*   Lab Work: Your physician recommends that you return for lab work in: next week lipids, ApoB and CMP You need to have labs done when you are fasting.  You can come Monday through Friday 8:30 am to 12:00 pm and 1:15 to 4:30. You do not need to make an appointment as the order has already been placed.   If you have labs (blood work) drawn today and your tests are completely normal, you will receive your results only by: MyChart Message (if you have MyChart) OR A paper copy in the mail If you have any lab test that is abnormal or we need to change your treatment, we will call you to review the results.   Testing/Procedures: We will order CT coronary calcium score. Needed 05/2025 It will cost $99.00 and is due at time of scan.  Please call to schedule.    [facility] [address] [place], [place] [address] [phone]  Follow-Up: At [facility], you and your health needs are our priority.  As part of our continuing mission to provide you with exceptional heart care, we have created designated Provider Care Teams.  These Care Teams include your primary Cardiologist (physician) and Advanced Practice Providers (APPs -  Physician Assistants and Nurse Practitioners) who all work together to provide you with the care you need, when you need it.  We recommend signing up for the patient portal called MyChart.  Sign up information is provided on this After Visit Summary.  MyChart is used to connect with patients for Virtual Visits (Telemedicine).  Patients are able to view lab/test results, encounter notes, upcoming appointments, etc.  Non-urgent messages can be sent to  your provider as well.   To learn more about what you can do with MyChart, go to [email or web address].    Your next appointment:   12 month(s)  The format for your next appointment:   In Person  Provider:   Redell Leiter, MD    Other Instructions none  Important Information About Sugar

## 2024-09-09 NOTE — Addendum Note (Signed)
 Addended by: ONEITA BERLINER on: 09/09/2024 02:54 PM   Modules accepted: Orders

## 2024-09-23 DIAGNOSIS — E782 Mixed hyperlipidemia: Secondary | ICD-10-CM | POA: Diagnosis not present

## 2024-09-25 ENCOUNTER — Ambulatory Visit: Payer: Self-pay | Admitting: Cardiology

## 2024-09-25 LAB — COMPREHENSIVE METABOLIC PANEL WITH GFR
ALT: 33 IU/L (ref 0–44)
AST: 29 IU/L (ref 0–40)
Albumin: 4.6 g/dL (ref 4.1–5.1)
Alkaline Phosphatase: 67 IU/L (ref 47–123)
BUN/Creatinine Ratio: 12 (ref 9–20)
BUN: 12 mg/dL (ref 6–24)
Bilirubin Total: 0.6 mg/dL (ref 0.0–1.2)
CO2: 25 mmol/L (ref 20–29)
Calcium: 9.6 mg/dL (ref 8.7–10.2)
Chloride: 104 mmol/L (ref 96–106)
Creatinine, Ser: 1.04 mg/dL (ref 0.76–1.27)
Globulin, Total: 2.3 g/dL (ref 1.5–4.5)
Glucose: 94 mg/dL (ref 70–99)
Potassium: 4.4 mmol/L (ref 3.5–5.2)
Sodium: 142 mmol/L (ref 134–144)
Total Protein: 6.9 g/dL (ref 6.0–8.5)
eGFR: 90 mL/min/1.73 (ref >59)

## 2024-09-25 LAB — LIPID PANEL+APOB
Apolipoprotein B: 111 mg/dL — ABNORMAL HIGH (ref <90)
Cholesterol, Total: 195 mg/dL (ref 100–199)
HDL-C: 37 mg/dL — ABNORMAL LOW (ref >39)
LDL-C (NIH Calc): 127 mg/dL — ABNORMAL HIGH (ref 0–99)
Non-HDL Cholesterol: 158 mg/dL — ABNORMAL HIGH (ref 0–129)
Triglycerides: 172 mg/dL — ABNORMAL HIGH (ref 0–149)

## 2025-05-10 ENCOUNTER — Other Ambulatory Visit (HOSPITAL_BASED_OUTPATIENT_CLINIC_OR_DEPARTMENT_OTHER): Admitting: Radiology
